# Patient Record
Sex: Male | Born: 1960 | Race: Black or African American | Hispanic: No | Marital: Single | Smoking: Current some day smoker
Health system: Southern US, Community
[De-identification: ages and names within clinical notes are randomized; demographics above are authoritative.]

## PROBLEM LIST (undated history)

## (undated) DIAGNOSIS — G629 Polyneuropathy, unspecified: Secondary | ICD-10-CM

## (undated) DIAGNOSIS — I2510 Atherosclerotic heart disease of native coronary artery without angina pectoris: Secondary | ICD-10-CM

## (undated) DIAGNOSIS — E119 Type 2 diabetes mellitus without complications: Secondary | ICD-10-CM

## (undated) DIAGNOSIS — N183 Chronic kidney disease, stage 3 unspecified: Secondary | ICD-10-CM

## (undated) DIAGNOSIS — D649 Anemia, unspecified: Secondary | ICD-10-CM

## (undated) DIAGNOSIS — I5042 Chronic combined systolic (congestive) and diastolic (congestive) heart failure: Secondary | ICD-10-CM

## (undated) DIAGNOSIS — I10 Essential (primary) hypertension: Secondary | ICD-10-CM

## (undated) DIAGNOSIS — M1990 Unspecified osteoarthritis, unspecified site: Secondary | ICD-10-CM

## (undated) HISTORY — PX: CARDIAC CATHETERIZATION: SHX172

## (undated) HISTORY — PX: CORONARY STENT PLACEMENT: SHX1402

## (undated) HISTORY — PX: CORONARY ANGIOPLASTY: SHX604

---

## 2008-09-29 ENCOUNTER — Ambulatory Visit: Payer: Self-pay | Admitting: Diagnostic Radiology

## 2008-09-29 ENCOUNTER — Emergency Department (HOSPITAL_BASED_OUTPATIENT_CLINIC_OR_DEPARTMENT_OTHER): Admission: EM | Admit: 2008-09-29 | Discharge: 2008-09-29 | Payer: Self-pay | Admitting: Emergency Medicine

## 2009-06-03 ENCOUNTER — Ambulatory Visit: Payer: Self-pay | Admitting: Diagnostic Radiology

## 2009-06-03 ENCOUNTER — Emergency Department (HOSPITAL_BASED_OUTPATIENT_CLINIC_OR_DEPARTMENT_OTHER): Admission: EM | Admit: 2009-06-03 | Discharge: 2009-06-03 | Payer: Self-pay | Admitting: Emergency Medicine

## 2009-09-20 ENCOUNTER — Emergency Department (HOSPITAL_BASED_OUTPATIENT_CLINIC_OR_DEPARTMENT_OTHER): Admission: EM | Admit: 2009-09-20 | Discharge: 2009-09-21 | Payer: Self-pay | Admitting: Emergency Medicine

## 2009-09-21 ENCOUNTER — Ambulatory Visit: Payer: Self-pay | Admitting: Diagnostic Radiology

## 2010-07-07 LAB — BASIC METABOLIC PANEL
CO2: 25 mEq/L (ref 19–32)
Calcium: 9.1 mg/dL (ref 8.4–10.5)
Chloride: 107 mEq/L (ref 96–112)
Potassium: 4 mEq/L (ref 3.5–5.1)
Sodium: 143 mEq/L (ref 135–145)

## 2010-07-09 LAB — GLUCOSE, CAPILLARY

## 2012-02-27 ENCOUNTER — Encounter (HOSPITAL_BASED_OUTPATIENT_CLINIC_OR_DEPARTMENT_OTHER): Payer: Self-pay | Admitting: Emergency Medicine

## 2012-02-27 ENCOUNTER — Emergency Department (HOSPITAL_BASED_OUTPATIENT_CLINIC_OR_DEPARTMENT_OTHER): Payer: Self-pay

## 2012-02-27 ENCOUNTER — Emergency Department (HOSPITAL_BASED_OUTPATIENT_CLINIC_OR_DEPARTMENT_OTHER)
Admission: EM | Admit: 2012-02-27 | Discharge: 2012-02-27 | Disposition: A | Discharge status: Home / Self Care | Payer: Self-pay | Attending: Emergency Medicine | Admitting: Emergency Medicine

## 2012-02-27 DIAGNOSIS — F17200 Nicotine dependence, unspecified, uncomplicated: Secondary | ICD-10-CM | POA: Insufficient documentation

## 2012-02-27 DIAGNOSIS — M25549 Pain in joints of unspecified hand: Secondary | ICD-10-CM | POA: Insufficient documentation

## 2012-02-27 DIAGNOSIS — M549 Dorsalgia, unspecified: Secondary | ICD-10-CM

## 2012-02-27 DIAGNOSIS — M79643 Pain in unspecified hand: Secondary | ICD-10-CM

## 2012-02-27 DIAGNOSIS — Z79899 Other long term (current) drug therapy: Secondary | ICD-10-CM | POA: Insufficient documentation

## 2012-02-27 DIAGNOSIS — E119 Type 2 diabetes mellitus without complications: Secondary | ICD-10-CM | POA: Insufficient documentation

## 2012-02-27 HISTORY — DX: Type 2 diabetes mellitus without complications: E11.9

## 2012-02-27 MED ORDER — DIAZEPAM 5 MG PO TABS
5.0000 mg | ORAL_TABLET | Freq: Once | ORAL | Status: AC
  Administered 2012-02-27: 5 mg via ORAL
  Filled 2012-02-27: qty 1

## 2012-02-27 MED ORDER — HYDROCODONE-ACETAMINOPHEN 5-325 MG PO TABS: 1.0000 | ORAL_TABLET | Freq: Four times a day (QID) | ORAL | Status: DC | PRN

## 2012-02-27 MED ORDER — DIAZEPAM 5 MG PO TABS: 5.0000 mg | ORAL_TABLET | Freq: Two times a day (BID) | ORAL | Status: DC | PRN

## 2012-02-27 MED ORDER — IBUPROFEN 800 MG PO TABS
800.0000 mg | ORAL_TABLET | Freq: Once | ORAL | Status: AC
  Administered 2012-02-27: 800 mg via ORAL
  Filled 2012-02-27: qty 1

## 2012-02-27 MED ORDER — HYDROCODONE-ACETAMINOPHEN 5-325 MG PO TABS
1.0000 | ORAL_TABLET | Freq: Once | ORAL | Status: AC
  Administered 2012-02-27: 1 via ORAL
  Filled 2012-02-27: qty 1

## 2012-02-27 MED ORDER — IBUPROFEN 800 MG PO TABS: 800.0000 mg | ORAL_TABLET | Freq: Three times a day (TID) | ORAL | Status: AC

## 2012-02-27 NOTE — ED Provider Notes (Addendum)
History     CSN: [id number]  Arrival date & time 02/27/12  4098   First MD Initiated Contact with Patient 02/27/12 [phone]      Chief Complaint  Patient presents with  . Back Pain  . Hand Pain    HPI  The patient presents with 2 main complaints. Chief complaint 1 back pain.  He notes over the past 3 days he said pain persistently in his low back, on the right greater than the left.  Pain is dull, though with sharp radiation down the posterior of the right leg.  The pain is worse with ambulation and weight-bearing.  The pain is minimally improved with use of narcotics, and OTC medication.  He denies concurrent fever, chills, distal dysesthesia, distal weakness, any incontinence. Chief complaint 2 finger pain.  He notes over the past months he has had pain in his 3 middle digits on the right hand.  The pain is focally about the MCP joints with radiation both proximally and distally.  The pain is sore, with associated edema.  There is no associated erythema, or distal dysesthesia.  The patient can move the hand in all dimensions, though there is pain with most motion.  Pain is minimally improved with the aforementioned analgesics. 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   Past Medical History  Diagnosis Date  . Diabetes mellitus without complication     No past surgical history on file.  No family history on file.  History  Substance Use Topics  . Smoking status: Current Every Day Smoker  . Smokeless tobacco: Not on file  . Alcohol Use:       Review of Systems  Constitutional:       Per HPI, otherwise negative  HENT:       Per HPI, otherwise negative  Eyes: Negative.   Respiratory:       Per HPI, otherwise negative  Cardiovascular:       Per HPI, otherwise negative  Gastrointestinal: Negative for nausea, vomiting, diarrhea and constipation.  Genitourinary: Negative.   Musculoskeletal:       Per HPI, otherwise negative  Skin: Negative.     Neurological: Negative for weakness and light-headedness.    Allergies  Review of patient's allergies indicates no known allergies.  Home Medications   Current Outpatient Rx  Name  Route  Sig  Dispense  Refill  . METFORMIN HCL 500 MG PO TABS   Oral   Take 1,000 mg by mouth 2 (two) times daily with a meal.            BP 141/86  Pulse 79  Temp 98 F (36.7 C) (Oral)  Resp 20  Ht 5\' 9"  (1.753 m)  Wt 199 lb (90.266 kg)  BMI 29.39 kg/m2  SpO2 98%  Physical Exam  Nursing note and vitals reviewed. Constitutional: He is oriented to person, place, and time. He appears well-developed. No distress.  HENT:  Head: Normocephalic and atraumatic.  Eyes: Conjunctivae normal and EOM are normal.  Cardiovascular: Normal rate and regular rhythm.   Pulmonary/Chest: Effort normal. No stridor. No respiratory distress.  Abdominal: He exhibits no distension.  Musculoskeletal: He exhibits no edema.       The right hand is slightly edematous compared to the left, with tenderness to palpation about the MCP and metacarpals primarily in digits 2, 3, 4.  The patient can flex and extend all digits, though he is diminished grip capacity secondary to pain. No teno-synovitis,  no snuffbox tenderness no wrist pain with palpation The patient can flex and extend each hip, though there is pain with straight leg rise on the right.  The knees and ankles are unremarkable.  The pelvis is stable.  Neurological: He is alert and oriented to person, place, and time. No cranial nerve deficit. He exhibits normal muscle tone. Coordination normal.  Skin: Skin is warm and dry.  Psychiatric: He has a normal mood and affect.    ED Course  Procedures (including critical care time)  Labs Reviewed - No data to display No results found.   No diagnosis found.  X-ray interpreted by me  MDM  This patient, presents in no distress, with 2 complaints.  Notably, the patient's back pain is new, most consistent with  musculoskeletal etiology absent neurologic complaints or findings. The patient's hand pain may be secondary to repetitive use.  With a negative x-ray, the patient was counseled on the need for ongoing analgesics, anti-inflammatories, as well as consideration of rest.  The patient was discharged with analgesics, following a discussion on the need for outpatient followup, consideration of physical therapy.  The patient has followup in 2 days.     Gerhard [name], MD 02/27/12 5621  Gerhard [name], MD 02/27/12 [phone]

## 2012-02-27 NOTE — ED Notes (Signed)
Pt c/o lower back pain since Wed.  No elimination problems.  Pt denies numbness.  Pt also c/o right hand/finger pain.  No injuries known.

## 2013-12-09 ENCOUNTER — Encounter (HOSPITAL_BASED_OUTPATIENT_CLINIC_OR_DEPARTMENT_OTHER): Payer: Self-pay | Admitting: Emergency Medicine

## 2013-12-09 ENCOUNTER — Emergency Department (HOSPITAL_BASED_OUTPATIENT_CLINIC_OR_DEPARTMENT_OTHER)
Admission: EM | Admit: 2013-12-09 | Discharge: 2013-12-09 | Disposition: A | Discharge status: Home / Self Care | Payer: Self-pay | Attending: Emergency Medicine | Admitting: Emergency Medicine

## 2013-12-09 DIAGNOSIS — F17200 Nicotine dependence, unspecified, uncomplicated: Secondary | ICD-10-CM | POA: Insufficient documentation

## 2013-12-09 DIAGNOSIS — M6281 Muscle weakness (generalized): Secondary | ICD-10-CM | POA: Insufficient documentation

## 2013-12-09 DIAGNOSIS — R358 Other polyuria: Secondary | ICD-10-CM | POA: Insufficient documentation

## 2013-12-09 DIAGNOSIS — J029 Acute pharyngitis, unspecified: Secondary | ICD-10-CM | POA: Insufficient documentation

## 2013-12-09 DIAGNOSIS — R52 Pain, unspecified: Secondary | ICD-10-CM

## 2013-12-09 DIAGNOSIS — E119 Type 2 diabetes mellitus without complications: Secondary | ICD-10-CM | POA: Insufficient documentation

## 2013-12-09 DIAGNOSIS — R739 Hyperglycemia, unspecified: Secondary | ICD-10-CM

## 2013-12-09 DIAGNOSIS — R3589 Other polyuria: Secondary | ICD-10-CM | POA: Insufficient documentation

## 2013-12-09 DIAGNOSIS — Z79899 Other long term (current) drug therapy: Secondary | ICD-10-CM | POA: Insufficient documentation

## 2013-12-09 LAB — CBC WITH DIFFERENTIAL/PLATELET
BASOS ABS: 0 10*3/uL (ref 0.0–0.1)
BASOS PCT: 0 % (ref 0–1)
EOS ABS: 0.1 10*3/uL (ref 0.0–0.7)
Eosinophils Relative: 1 % (ref 0–5)
HCT: 42.3 % (ref 39.0–52.0)
Hemoglobin: 14.9 g/dL (ref 13.0–17.0)
LYMPHS ABS: 1.5 10*3/uL (ref 0.7–4.0)
Lymphocytes Relative: 26 % (ref 12–46)
MCH: 32.3 pg (ref 26.0–34.0)
MCHC: 35.2 g/dL (ref 30.0–36.0)
MCV: 91.6 fL (ref 78.0–100.0)
Monocytes Absolute: 1 10*3/uL (ref 0.1–1.0)
Monocytes Relative: 17 % — ABNORMAL HIGH (ref 3–12)
NEUTROS ABS: 3.2 10*3/uL (ref 1.7–7.7)
Neutrophils Relative %: 56 % (ref 43–77)
Platelets: 177 10*3/uL (ref 150–400)
RBC: 4.62 MIL/uL (ref 4.22–5.81)
RDW: 12.1 % (ref 11.5–15.5)
WBC: 5.7 10*3/uL (ref 4.0–10.5)

## 2013-12-09 LAB — COMPREHENSIVE METABOLIC PANEL
ALBUMIN: 3.1 g/dL — AB (ref 3.5–5.2)
ALK PHOS: 54 U/L (ref 39–117)
ALT: 33 U/L (ref 0–53)
AST: 33 U/L (ref 0–37)
Anion gap: 14 (ref 5–15)
BUN: 12 mg/dL (ref 6–23)
CALCIUM: 9.5 mg/dL (ref 8.4–10.5)
CHLORIDE: 94 meq/L — AB (ref 96–112)
CO2: 23 mEq/L (ref 19–32)
Creatinine, Ser: 1 mg/dL (ref 0.50–1.35)
GFR calc Af Amer: >90 mL/min (ref >90)
GFR calc non Af Amer: 84 mL/min — ABNORMAL LOW (ref >90)
GLUCOSE: 508 mg/dL — AB (ref 70–99)
POTASSIUM: 4 meq/L (ref 3.7–5.3)
SODIUM: 131 meq/L — AB (ref 137–147)
Total Bilirubin: 0.3 mg/dL (ref 0.3–1.2)
Total Protein: 7.1 g/dL (ref 6.0–8.3)

## 2013-12-09 LAB — CBG MONITORING, ED: Glucose-Capillary: 89 mg/dL (ref 70–99)

## 2013-12-09 LAB — I-STAT VENOUS BLOOD GAS, ED
BICARBONATE: 25.6 meq/L — AB (ref 20.0–24.0)
O2 Saturation: 90 %
PH VEN: 7.391 — AB (ref 7.250–7.300)
PO2 VEN: 59 mmHg — AB (ref 30.0–45.0)
TCO2: 27 mmol/L (ref 0–100)
pCO2, Ven: 42.2 mmHg — ABNORMAL LOW (ref 45.0–50.0)

## 2013-12-09 LAB — URINALYSIS, ROUTINE W REFLEX MICROSCOPIC
BILIRUBIN URINE: NEGATIVE
KETONES UR: NEGATIVE mg/dL
Nitrite: NEGATIVE
Protein, ur: 30 mg/dL — AB
SPECIFIC GRAVITY, URINE: 1.041 — AB (ref 1.005–1.030)
Urobilinogen, UA: 1 mg/dL (ref 0.0–1.0)
pH: 6 (ref 5.0–8.0)

## 2013-12-09 LAB — URINE MICROSCOPIC-ADD ON

## 2013-12-09 MED ORDER — INSULIN GLARGINE 100 UNIT/ML ~~LOC~~ SOLN
10.0000 [IU] | Freq: Once | SUBCUTANEOUS | Status: DC
Start: 2013-12-09 — End: 2013-12-09
  Filled 2013-12-09: qty 10

## 2013-12-09 MED ORDER — SODIUM CHLORIDE 0.9 % IV BOLUS (SEPSIS)
1000.0000 mL | Freq: Once | INTRAVENOUS | Status: AC
  Administered 2013-12-09: 1000 mL via INTRAVENOUS

## 2013-12-09 MED ORDER — INSULIN ASPART 100 UNIT/ML ~~LOC~~ SOLN
10.0000 [IU] | Freq: Once | SUBCUTANEOUS | Status: AC
  Administered 2013-12-09: 10 [IU] via INTRAVENOUS
  Filled 2013-12-09: qty 1

## 2013-12-09 NOTE — Discharge Instructions (Signed)
As discussed, it is important that you follow up as soon as possible with your physician for continued management of your condition. ° °If you develop any new, or concerning changes in your condition, please return to the emergency department immediately. ° °

## 2013-12-09 NOTE — ED Provider Notes (Signed)
CSN: [id number]     Arrival date & time 12/09/13  1827 History  This chart was scribed for Eddie Foster Ervan Heber, MD, by Yevette EdwardsAngela Bracken, ED Scribe. This patient was seen in room MH07/MH07 and the patient's care was started at 7:20 PM.   First MD Initiated Contact with Patient 12/09/13 1909     Chief Complaint  Patient presents with  . Generalized Body Aches    The history is provided by the patient. No language interpreter was used.   HPI Comments: Eddie McalpineRonnie Foster is a 53 y.o. male, with a h/o DM, who presents to the Emergency Department complaining of a fever, onset of three days, which has gradually improved. Mr. Eddie Foster also endorses myalgia, a headache, fatigue, a sore throat and chills.  Additionally, he voices two days of diaphoresis. In the ED his temperature is 98.1 F. Mr. Eddie Foster states he has increased his fluid consumption to treat his symptoms, and he attributes his polyuria to the increased fluids. He denies a h/o DKA. He also denies chest pains, dyspnea, and abdominal pain. Mr. Eddie Foster reports a h/o Hepatitis C.     Past Medical History  Diagnosis Date  . Diabetes mellitus without complication    History reviewed. No pertinent past surgical history. No family history on file. History  Substance Use Topics  . Smoking status: Current Every Day Smoker  . Smokeless tobacco: Not on file  . Alcohol Use: No    Review of Systems  Constitutional: Positive for fever, chills, diaphoresis and fatigue.  HENT: Positive for sore throat.   Respiratory: Negative for shortness of breath.   Cardiovascular: Negative for chest pain.  Gastrointestinal: Negative for abdominal pain.  Endocrine: Positive for polyuria.  Musculoskeletal: Positive for myalgias.  All other systems reviewed and are negative.   Allergies  Review of patient's allergies indicates no known allergies.  Home Medications   Prior to Admission medications   Medication Sig Start Date End Date Taking? Authorizing Provider   diazepam (VALIUM) 5 MG tablet Take 1 tablet (5 mg total) by mouth every 12 (twelve) hours as needed for anxiety. 02/27/12   Eddie Foster Eddie Goldwasser, MD  HYDROcodone-acetaminophen (NORCO/VICODIN) 5-325 MG per tablet Take 1 tablet by mouth every 6 (six) hours as needed for pain. 02/27/12   Eddie Foster Eddie Cavallaro, MD  metFORMIN (GLUCOPHAGE) 500 MG tablet Take 1,000 mg by mouth 2 (two) times daily with a meal.     Historical Provider, MD   Triage Vitals: BP 124/94  Pulse 103  Temp(Src) 98.1 F (36.7 C) (Oral)  Resp 16  Ht 5\' 8"  (1.727 m)  Wt 160 lb (72.576 kg)  BMI 24.33 kg/m2  SpO2 100%  Physical Exam  Nursing note and vitals reviewed. Constitutional: He is oriented to person, place, and time. He appears well-developed. No distress.  HENT:  Head: Normocephalic and atraumatic.  Eyes: Conjunctivae and EOM are normal.  Cardiovascular: Normal rate, regular rhythm and normal heart sounds.   No murmur heard. Pulmonary/Chest: Effort normal and breath sounds normal. No stridor. No respiratory distress. He has no wheezes. He has no rales.  Abdominal: He exhibits no distension. There is no tenderness.  Musculoskeletal: He exhibits no edema.  Neurological: He is alert and oriented to person, place, and time.  Skin: Skin is warm and dry.  Psychiatric: He has a normal mood and affect.    ED Course  Procedures (including critical care time)  DIAGNOSTIC STUDIES: Oxygen Saturation is 100% on room air, normal by my interpretation.    COORDINATION  OF CARE:  7:24 PM- Discussed treatment plan with patient, and the patient agreed to the plan. The plan includes pain medication, IV fluids, CBC, CMP, Venous Blood Gas, and a UA.   Labs Review Labs Reviewed  CBC WITH DIFFERENTIAL - Abnormal; Notable for the following:    Monocytes Relative 17 (*)    All other components within normal limits  COMPREHENSIVE METABOLIC PANEL - Abnormal; Notable for the following:    Sodium 131 (*)    Chloride 94 (*)    Glucose, Bld  508 (*)    Albumin 3.1 (*)    GFR calc non Af Amer 84 (*)    All other components within normal limits  URINALYSIS, ROUTINE W REFLEX MICROSCOPIC - Abnormal; Notable for the following:    APPearance CLOUDY (*)    Specific Gravity, Urine 1.041 (*)    Glucose, UA >1000 (*)    Hgb urine dipstick MODERATE (*)    Protein, ur 30 (*)    Leukocytes, UA SMALL (*)    All other components within normal limits  URINE MICROSCOPIC-ADD ON - Abnormal; Notable for the following:    Bacteria, UA FEW (*)    All other components within normal limits  I-STAT VENOUS BLOOD GAS, ED - Abnormal; Notable for the following:    pH, Ven 7.391 (*)    pCO2, Ven 42.2 (*)    pO2, Ven 59.0 (*)    Bicarbonate 25.6 (*)    All other components within normal limits  BLOOD GAS, VENOUS  CBG MONITORING, ED   Update: Symptoms have resolved, blood glucose is less than 100, no new complaints.  We discussed at length, the need for primary care followup. MDM   Final diagnoses:  Hyperglycemia  Body aches    I personally performed the services described in this documentation, which was scribed in my presence. The recorded information has been reviewed and is accurate.   Patient presents with generalized body aches, in spite of hyperglycemia, with a portal and anion gap.  Patient has resolution of symptoms, and substantial decrease in his close level with no complaints, no fever, chills or other suggestion of early infection or metabolic compromise.  Patient was discharged in stable condition to followup with primary care for consideration of new antihyperglycemic regimen.  Eddie [name], MD 12/10/13 [phone]

## 2013-12-09 NOTE — ED Notes (Signed)
Reports chills and fever x 2 days. Sts bones hurt.

## 2014-06-17 ENCOUNTER — Encounter (HOSPITAL_BASED_OUTPATIENT_CLINIC_OR_DEPARTMENT_OTHER): Payer: Self-pay | Admitting: *Deleted

## 2014-06-17 ENCOUNTER — Emergency Department (HOSPITAL_BASED_OUTPATIENT_CLINIC_OR_DEPARTMENT_OTHER)
Admission: EM | Admit: 2014-06-17 | Discharge: 2014-06-17 | Disposition: A | Discharge status: Home / Self Care | Payer: Self-pay | Attending: Emergency Medicine | Admitting: Emergency Medicine

## 2014-06-17 DIAGNOSIS — Z79899 Other long term (current) drug therapy: Secondary | ICD-10-CM | POA: Insufficient documentation

## 2014-06-17 DIAGNOSIS — Z87891 Personal history of nicotine dependence: Secondary | ICD-10-CM | POA: Insufficient documentation

## 2014-06-17 DIAGNOSIS — E1142 Type 2 diabetes mellitus with diabetic polyneuropathy: Secondary | ICD-10-CM | POA: Insufficient documentation

## 2014-06-17 DIAGNOSIS — E1140 Type 2 diabetes mellitus with diabetic neuropathy, unspecified: Secondary | ICD-10-CM

## 2014-06-17 HISTORY — DX: Polyneuropathy, unspecified: G62.9

## 2014-06-17 MED ORDER — PREGABALIN 50 MG PO CAPS: 50.0000 mg | ORAL_CAPSULE | Freq: Two times a day (BID) | ORAL | Status: DC

## 2014-06-17 MED ORDER — OXYCODONE-ACETAMINOPHEN 5-325 MG PO TABS: 1.0000 | ORAL_TABLET | Freq: Four times a day (QID) | ORAL | Status: DC | PRN

## 2014-06-17 NOTE — ED Notes (Signed)
Pt standing at door, ready to go.  Left without repeating vital signs.

## 2014-06-17 NOTE — ED Notes (Signed)
Pt c/o leg pain- has been dx with neuropathy at PCP last week but meds not working

## 2014-06-17 NOTE — Discharge Instructions (Signed)
Diabetic Neuropathy Diabetic neuropathy is a nerve disease or nerve damage that is caused by diabetes mellitus. About half of all people with diabetes mellitus have some form of nerve damage. Nerve damage is more common in those who have had diabetes mellitus for many years and who generally have not had good control of their blood sugar (glucose) level. Diabetic neuropathy is a common complication of diabetes mellitus. There are three more common types of diabetic neuropathy and a fourth type that is less common and less understood:   Peripheral neuropathy--This is the most common type of diabetic neuropathy. It causes damage to the nerves of the feet and legs first and then eventually the hands and arms.The damage affects the ability to sense touch.  Autonomic neuropathy--This type causes damage to the autonomic nervous system, which controls the following functions:  Heartbeat.  Body temperature.  Blood pressure.  Urination.  Digestion.  Sweating.  Sexual function.  Focal neuropathy--Focal neuropathy can be painful and unpredictable and occurs most often in older adults with diabetes mellitus. It involves a specific nerve or one area and often comes on suddenly. It usually does not cause long-term problems.  Radiculoplexus neuropathy-- Sometimes called lumbosacral radiculoplexus neuropathy, radiculoplexus neuropathy affects the nerves of the thighs, hips, buttocks, or legs. It is more common in people with type 2 diabetes mellitus and in older men. It is characterized by debilitating pain, weakness, and atrophy, usually in the thigh muscles. CAUSES  The cause of peripheral, autonomic, and focal neuropathies is diabetes mellitus that is uncontrolled and high glucose levels. The cause of radiculoplexus neuropathy is unknown. However, it is thought to be caused by inflammation related to uncontrolled glucose levels. SIGNS AND SYMPTOMS  Peripheral Neuropathy Peripheral neuropathy develops  slowly over time. When the nerves of the feet and legs no longer work there may be:   Burning, stabbing, or aching pain in the legs or feet.  Inability to feel pressure or pain in your feet. This can lead to:  Thick calluses over pressure areas.  Pressure sores.  Ulcers.  Foot deformities.  Reduced ability to feel temperature changes.  Muscle weakness. Autonomic Neuropathy The symptoms of autonomic neuropathy vary depending on which nerves are affected. Symptoms may include:  Problems with digestion, such as:  Feeling sick to your stomach (nausea).  Vomiting.  Bloating.  Constipation.  Diarrhea.  Abdominal pain.  Difficulty with urination. This occurs if you lose your ability to sense when your bladder is full. Problems include:  Urine leakage (incontinence).  Inability to empty your bladder completely (retention).  Rapid or irregular heartbeat (palpitations).  Blood pressure drops when you stand up (orthostatic hypotension). When you stand up you may feel:  Dizzy.  Weak.  Faint.  In men, inability to attain and maintain an erection.  In women, vaginal dryness and problems with decreased sexual desire and arousal.  Problems with body temperature regulation.  Increased or decreased sweating. Focal Neuropathy  Abnormal eye movements or abnormal alignment of both eyes.  Weakness in the wrist.  Foot drop. This results in an inability to lift the foot properly and abnormal walking or foot movement.  Paralysis on one side of your face (Bell palsy).  Chest or abdominal pain. Radiculoplexus Neuropathy  Sudden, severe pain in your hip, thigh, or buttocks.  Weakness and wasting of thigh muscles.  Difficulty rising from a seated position.  Abdominal swelling.  Unexplained weight loss (usually more than 10 lb [4.5 kg]). DIAGNOSIS  Peripheral Neuropathy Your senses may   be tested. Sensory function testing can be done with:  A light touch using a  monofilament.  A vibration with tuning fork.  A sharp sensation with a pin prick. Other tests that can help diagnose neuropathy are:  Nerve conduction velocity. This test checks the transmission of an electrical current through a nerve.  Electromyography. This shows how muscles respond to electrical signals transmitted by nearby nerves.  Quantitative sensory testing. This is used to assess how your nerves respond to vibrations and changes in temperature. Autonomic Neuropathy Diagnosis is often based on reported symptoms. Tell your health care provider if you experience:   Dizziness.   Constipation.   Diarrhea.   Inappropriate urination or inability to urinate.   Inability to get or maintain an erection.  Tests that may be done include:   Electrocardiography or Holter monitor. These are tests that can help show problems with the heart rate or heart rhythm.   An X-ray exam may be done. Focal Neuropathy Diagnosis is made based on your symptoms and what your health care provider finds during your exam. Other tests may be done. They may include:  Nerve conduction velocities. This checks the transmission of electrical current through a nerve.  Electromyography. This shows how muscles respond to electrical signals transmitted by nearby nerves.  Quantitative sensory testing. This test is used to assess how your nerves respond to vibration and changes in temperature. Radiculoplexus Neuropathy  Often the first thing is to eliminate any other issue or problems that might be the cause, as there is no stick test for diagnosis.  X-ray exam of your spine and lumbar region.  Spinal tap to rule out cancer.  MRI to rule out other lesions. TREATMENT  Once nerve damage occurs, it cannot be reversed. The goal of treatment is to keep the disease or nerve damage from getting worse and affecting more nerve fibers. Controlling your blood glucose level is the key. Most people with  radiculoplexus neuropathy see at least a partial improvement over time. You will need to keep your blood glucose and HbA1c levels in the target range determined by your health care provider. Things that help control blood glucose levels include:   Blood glucose monitoring.   Meal planning.   Physical activity.   Diabetes medicine.  Over time, maintaining lower blood glucose levels helps lessen symptoms. Sometimes, prescription pain medicine is needed. HOME CARE INSTRUCTIONS:  Do not smoke.  Keep your blood glucose level in the range that you and your health care provider have determined acceptable for you.  Keep your blood pressure level in the range that you and your health care provider have determined acceptable for you.  Eat a well-balanced diet.  Be active every day.  Check your feet every day. SEEK MEDICAL CARE IF:   You have burning, stabbing, or aching pain in the legs or feet.  You are unable to feel pressure or pain in your feet.  You develop problems with digestion such as:  Nausea.  Vomiting.  Bloating.  Constipation.  Diarrhea.  Abdominal pain.  You have difficulty with urination, such as:  Incontinence.  Retention.  You have palpitations.  You develop orthostatic hypotension. When you stand up you may feel:  Dizzy.  Weak.  Faint.  You cannot attain and maintain an erection (in men).  You have vaginal dryness and problems with decreased sexual desire and arousal (in women).  You have severe pain in your thighs, legs, or buttocks.  You have unexplained weight loss.   Document Released: 06/15/2001 Document Revised: 01/25/2013 Document Reviewed: 09/15/2012 [employer] Patient Information 2015 [employer]. This information is not intended to replace advice given to you by your health care provider. Make sure you discuss any questions you have with your health care provider. 

## 2014-06-17 NOTE — ED Provider Notes (Signed)
CSN: [id number]     Arrival date & time 06/17/14  1440 History   First MD Initiated Contact with Patient 06/17/14 1627     Chief Complaint  Patient presents with  . Leg Pain     (Consider location/radiation/quality/duration/timing/severity/associated sxs/prior Treatment) Patient is a 54 y.o. male presenting with leg pain.  Leg Pain Associated symptoms: no fever    patient has had pain in his left lower leg the last few weeks. [facility] and started on ibuprofen. Diagnosed with diabetic neuropathy. He feels a burning pain. It is worse with walking. No fevers. No trauma. No relief with his ibuprofen. Sugars have been up to 200 at the highest. No chest pain. No trouble breathing. No rash.  Past Medical History  Diagnosis Date  . Diabetes mellitus without complication   . Neuropathy    History reviewed. No pertinent past surgical history. No family history on file. History  Substance Use Topics  . Smoking status: Former Smoker    Types: Cigarettes  . Smokeless tobacco: Not on file  . Alcohol Use: No    Review of Systems  Constitutional: Negative for fever.  Respiratory: Negative for shortness of breath.   Cardiovascular: Negative for leg swelling.  Gastrointestinal: Negative for abdominal pain.  Musculoskeletal: Positive for gait problem. Negative for joint swelling.  Skin: Negative for rash and wound.  Neurological: Positive for numbness. Negative for facial asymmetry and weakness.      Allergies  Review of patient's allergies indicates no known allergies.  Home Medications   Prior to Admission medications   Medication Sig Start Date End Date Taking? Authorizing Provider  ibuprofen (ADVIL,MOTRIN) 600 MG tablet Take 600 mg by mouth every 6 (six) hours as needed.   Yes Historical Provider, MD  metFORMIN (GLUCOPHAGE) 500 MG tablet Take 1,000 mg by mouth 2 (two) times daily with a meal.    Yes Historical Provider, MD  diazepam (VALIUM) 5 MG tablet  Take 1 tablet (5 mg total) by mouth every 12 (twelve) hours as needed for anxiety. 02/27/12   Gerhard [name], MD  HYDROcodone-acetaminophen (NORCO/VICODIN) 5-325 MG per tablet Take 1 tablet by mouth every 6 (six) hours as needed for pain. 02/27/12   Gerhard [name], MD  oxyCODONE-acetaminophen (PERCOCET/ROXICET) 5-325 MG per tablet Take 1-2 tablets by mouth every 6 (six) hours as needed for severe pain. 06/17/14   Juliet Rude. Ester Mabe, MD  pregabalin (LYRICA) 50 MG capsule Take 1 capsule (50 mg total) by mouth 2 (two) times daily. 06/17/14   Juliet Rude. Afua Hoots, MD   BP 123/109 mmHg  Pulse 99  Temp(Src) 99 F (37.2 C) (Oral)  Resp 20  Ht  (1.753 m)  Wt 181 lb (82.101 kg)  BMI 26.72 kg/m2  SpO2 100% Physical Exam  Constitutional: He appears well-developed and well-nourished.  HENT:  Head: Atraumatic.  Cardiovascular: Normal rate and regular rhythm.   Pulmonary/Chest: Effort normal.  Abdominal: Soft.  Musculoskeletal: Normal range of motion.  Mild tenderness over right calf. No erythema. Strong dorsalis views pulse. No pain over ankle.  Neurological: He is alert.  Skin: Skin is warm.    ED Course  Procedures (including critical care time) Labs Review Labs Reviewed - No data to display  Imaging Review No results found.   EKG Interpretation None      MDM   Final diagnoses:  Diabetic neuropathy, painful    Patient with diabetic neuropathy. Doubt DVT. Will adjust medications and have him follow-up.  Juliet Rude. Rubin Payor, MD 06/17/14 [phone]

## 2014-11-22 ENCOUNTER — Encounter (HOSPITAL_BASED_OUTPATIENT_CLINIC_OR_DEPARTMENT_OTHER): Payer: Self-pay

## 2014-11-22 ENCOUNTER — Emergency Department (HOSPITAL_BASED_OUTPATIENT_CLINIC_OR_DEPARTMENT_OTHER)
Admission: EM | Admit: 2014-11-22 | Discharge: 2014-11-22 | Disposition: A | Discharge status: Home / Self Care | Payer: Self-pay | Attending: Emergency Medicine | Admitting: Emergency Medicine

## 2014-11-22 DIAGNOSIS — Z79899 Other long term (current) drug therapy: Secondary | ICD-10-CM | POA: Insufficient documentation

## 2014-11-22 DIAGNOSIS — M542 Cervicalgia: Secondary | ICD-10-CM | POA: Insufficient documentation

## 2014-11-22 DIAGNOSIS — G8929 Other chronic pain: Secondary | ICD-10-CM | POA: Insufficient documentation

## 2014-11-22 DIAGNOSIS — Z87891 Personal history of nicotine dependence: Secondary | ICD-10-CM | POA: Insufficient documentation

## 2014-11-22 DIAGNOSIS — M1990 Unspecified osteoarthritis, unspecified site: Secondary | ICD-10-CM | POA: Insufficient documentation

## 2014-11-22 DIAGNOSIS — E119 Type 2 diabetes mellitus without complications: Secondary | ICD-10-CM | POA: Insufficient documentation

## 2014-11-22 DIAGNOSIS — M79641 Pain in right hand: Secondary | ICD-10-CM | POA: Insufficient documentation

## 2014-11-22 DIAGNOSIS — M79642 Pain in left hand: Secondary | ICD-10-CM | POA: Insufficient documentation

## 2014-11-22 DIAGNOSIS — G629 Polyneuropathy, unspecified: Secondary | ICD-10-CM | POA: Insufficient documentation

## 2014-11-22 HISTORY — DX: Unspecified osteoarthritis, unspecified site: M19.90

## 2014-11-22 MED ORDER — METHOCARBAMOL 500 MG PO TABS: 500.0000 mg | ORAL_TABLET | Freq: Three times a day (TID) | ORAL | Status: DC | PRN

## 2014-11-22 MED ORDER — KETOROLAC TROMETHAMINE 60 MG/2ML IM SOLN
60.0000 mg | Freq: Once | INTRAMUSCULAR | Status: AC
  Administered 2014-11-22: 60 mg via INTRAMUSCULAR
  Filled 2014-11-22: qty 2

## 2014-11-22 MED ORDER — NAPROXEN 500 MG PO TABS: 500.0000 mg | ORAL_TABLET | Freq: Two times a day (BID) | ORAL | Status: DC

## 2014-11-22 NOTE — Discharge Instructions (Signed)
Take robaxin as prescribed for muscle spasm. No driving or operating heavy machinery while taking this drug as it may cause drowsiness. Take naproxen as prescribed. Follow up with your primary care doctor in 1 week.  Musculoskeletal Pain Musculoskeletal pain is muscle and boney aches and pains. These pains can occur in any part of the body. Your caregiver may treat you without knowing the cause of the pain. They may treat you if blood or urine tests, X-rays, and other tests were normal.  CAUSES There is often not a definite cause or reason for these pains. These pains may be caused by a type of germ (virus). The discomfort may also come from overuse. Overuse includes working out too hard when your body is not fit. Boney aches also come from weather changes. Bone is sensitive to atmospheric pressure changes. HOME CARE INSTRUCTIONS   Ask when your test results will be ready. Make sure you get your test results.  Only take over-the-counter or prescription medicines for pain, discomfort, or fever as directed by your caregiver. If you were given medications for your condition, do not drive, operate machinery or power tools, or sign legal documents for 24 hours. Do not drink alcohol. Do not take sleeping pills or other medications that may interfere with treatment.  Continue all activities unless the activities cause more pain. When the pain lessens, slowly resume normal activities. Gradually increase the intensity and duration of the activities or exercise.  During periods of severe pain, bed rest may be helpful. Lay or sit in any position that is comfortable.  Putting ice on the injured area.  Put ice in a bag.  Place a towel between your skin and the bag.  Leave the ice on for 15 to 20 minutes, 3 to 4 times a day.  Follow up with your caregiver for continued problems and no reason can be found for the pain. If the pain becomes worse or does not go away, it may be necessary to repeat tests or do  additional testing. Your caregiver may need to look further for a possible cause. SEEK IMMEDIATE MEDICAL CARE IF:  You have pain that is getting worse and is not relieved by medications.  You develop chest pain that is associated with shortness or breath, sweating, feeling sick to your stomach (nauseous), or throw up (vomit).  Your pain becomes localized to the abdomen.  You develop any new symptoms that seem different or that concern you. MAKE SURE YOU:   Understand these instructions.  Will watch your condition.  Will get help right away if you are not doing well or get worse. Document Released: 04/06/2005 Document Revised: 06/29/2011 Document Reviewed: 12/09/2012 [facility] Patient Information 2015 [place], [place]. This information is not intended to replace advice given to you by your health care provider. Make sure you discuss any questions you have with your health care provider.  Chronic Pain Chronic pain can be defined as pain that is off and on and lasts for 3-6 months or longer. Many things cause chronic pain, which can make it difficult to make a diagnosis. There are many treatment options available for chronic pain. However, finding a treatment that works well for you may require trying various approaches until the right one is found. Many people benefit from a combination of two or more types of treatment to control their pain. SYMPTOMS  Chronic pain can occur anywhere in the body and can range from mild to very severe. Some types of chronic pain include:  Headache.  Low back pain.  Cancer pain.  Arthritis pain.  Neurogenic pain. This is pain resulting from damage to nerves. People with chronic pain may also have other symptoms such as:  Depression.  Anger.  Insomnia.  Anxiety. DIAGNOSIS  Your health care provider will help diagnose your condition over time. In many cases, the initial focus will be on excluding possible conditions that could be causing the  pain. Depending on your symptoms, your health care provider may order tests to diagnose your condition. Some of these tests may include:   Blood tests.   CT scan.   MRI.   X-rays.   Ultrasounds.   Nerve conduction studies.  You may need to see a specialist.  TREATMENT  Finding treatment that works well may take time. You may be referred to a pain specialist. He or she may prescribe medicine or therapies, such as:   Mindful meditation or yoga.  Shots (injections) of numbing or pain-relieving medicines into the spine or area of pain.  Local electrical stimulation.  Acupuncture.   Massage therapy.   Aroma, color, light, or sound therapy.   Biofeedback.   Working with a physical therapist to keep from getting stiff.   Regular, gentle exercise.   Cognitive or behavioral therapy.   Group support.  Sometimes, surgery may be recommended.  HOME CARE INSTRUCTIONS   Take all medicines as directed by your health care provider.   Lessen stress in your life by relaxing and doing things such as listening to calming music.   Exercise or be active as directed by your health care provider.   Eat a healthy diet and include things such as vegetables, fruits, fish, and lean meats in your diet.   Keep all follow-up appointments with your health care provider.   Attend a support group with others suffering from chronic pain. SEEK MEDICAL CARE IF:   Your pain gets worse.   You develop a new pain that was not there before.   You cannot tolerate medicines given to you by your health care provider.   You have new symptoms since your last visit with your health care provider.  SEEK IMMEDIATE MEDICAL CARE IF:   You feel weak.   You have decreased sensation or numbness.   You lose control of bowel or bladder function.   Your pain suddenly gets much worse.   You develop shaking.  You develop chills.  You develop confusion.  You develop chest  pain.  You develop shortness of breath.  MAKE SURE YOU:  Understand these instructions.  Will watch your condition.  Will get help right away if you are not doing well or get worse. Document Released: 12/27/2001 Document Revised: 12/07/2012 Document Reviewed: 09/30/2012 [facility] Patient Information 2015 [place], [place]. This information is not intended to replace advice given to you by your health care provider. Make sure you discuss any questions you have with your health care provider.

## 2014-11-22 NOTE — ED Provider Notes (Signed)
CSN: [id number]     Arrival date & time 11/22/14  1121 History   First MD Initiated Contact with Patient 11/22/14 1203     Chief Complaint  Patient presents with  . Neck Pain     (Consider location/radiation/quality/duration/timing/severity/associated sxs/prior Treatment) HPI Comments: 54 year old male with a past medical history of arthritis, chronic neck pain, neuropathy and diabetes presenting with gradually worsening neck pain over the past 2 years, worsening this morning when he woke up. States he has been incarcerated, and while incarcerated he was given medicine for his neck twice daily. He was released from prison one week ago and has been without this medication for a week. He does not know the name of the medication. No known injury or trauma. Reports a history of arthritis in his neck and hands. Occasionally he has tingling into his hands and takes gabapentin for neuropathy. Pain is described as an aching sensation that is worse with neck movement and he feels "crunching" in his neck. Pain 8/10 with movement. States hand pain has remained the same, he was advised in the past to stop cracking his knuckles which she is still doing. Denies extremity numbness or weakness. Denies fever, chills or rash.  Patient is a 54 y.o. male presenting with neck pain. The history is provided by the patient.  Neck Pain   Past Medical History  Diagnosis Date  . Diabetes mellitus without complication   . Neuropathy   . Arthritis    History reviewed. No pertinent past surgical history. No family history on file. History  Substance Use Topics  . Smoking status: Former Smoker    Types: Cigarettes  . Smokeless tobacco: Not on file  . Alcohol Use: No    Review of Systems  Musculoskeletal: Positive for neck pain.       + BL hand pain.  All other systems reviewed and are negative.     Allergies  Review of patient's allergies indicates no known allergies.  Home Medications   Prior to  Admission medications   Medication Sig Start Date End Date Taking? Authorizing Provider  GABAPENTIN PO Take by mouth.   Yes Historical Provider, MD  GLYBURIDE PO Take by mouth.   Yes Historical Provider, MD  Sertraline HCl (ZOLOFT PO) Take by mouth.   Yes Historical Provider, MD  metFORMIN (GLUCOPHAGE) 500 MG tablet Take 1,000 mg by mouth 2 (two) times daily with a meal.     Historical Provider, MD  methocarbamol (ROBAXIN) 500 MG tablet Take 1 tablet (500 mg total) by mouth every 8 (eight) hours as needed for muscle spasms. 11/22/14   Kathrynn Speed, PA-C  naproxen (NAPROSYN) 500 MG tablet Take 1 tablet (500 mg total) by mouth 2 (two) times daily. 11/22/14   Natallie Ravenscroft M Aziah Brostrom, PA-C   BP 137/88 mmHg  Pulse 76  Temp(Src) 98.1 F (36.7 C) (Oral)  Resp 18  Ht  (1.727 m)  Wt 180 lb (81.647 kg)  BMI 27.38 kg/m2  SpO2 100% Physical Exam  Constitutional: He is oriented to person, place, and time. He appears well-developed and well-nourished. No distress.  HENT:  Head: Normocephalic and atraumatic.  Mouth/Throat: Oropharynx is clear and moist.  Eyes: Conjunctivae are normal.  Neck: Normal range of motion. Neck supple. No spinous process tenderness and no muscular tenderness present.  No meningeal signs.  Cardiovascular: Normal rate, regular rhythm and normal heart sounds.   Pulmonary/Chest: Effort normal and breath sounds normal. No respiratory distress.  Musculoskeletal: He exhibits no  edema.  TTP lower cervical spine and cervical paraspinal muscles. No edema or step-off. FROM, pain in all directions with a "stiff" feeling.  Neurological: He is alert and oriented to person, place, and time. He has normal strength.  Strength upper extremities 5/5 and equal bilateral. Sensation intact. Normal gait.  Skin: Skin is warm and dry. No rash noted. He is not diaphoretic.  Psychiatric: He has a normal mood and affect. His behavior is normal.  Nursing note and vitals reviewed.   ED Course  Procedures  (including critical care time) Labs Review Labs Reviewed - No data to display  Imaging Review No results found.   EKG Interpretation None      MDM   Final diagnoses:  Chronic neck pain   Nontoxic appearing, NAD. AF VSS. No red flags concerning patient's neck pain. No fevers, rash or meningeal signs concerning for meningitis, no focal neurologic deficits. No signs or symptoms of central cord compression. Pain ongoing for 2 years. Advised the patient to discuss this with his PCP. Will Rx naproxen and Robaxin. Stable for discharge. Return precautions given. Patient states understanding of treatment care plan and is agreeable.   Kathrynn Speed, PA-C 11/22/14 1237  Rolan Bucco, MD 11/22/14 [phone]

## 2014-11-22 NOTE — ED Notes (Signed)
C/o pain to neck and hands-states dx with arthritis approx 1-2 years ago

## 2015-05-04 ENCOUNTER — Emergency Department (HOSPITAL_BASED_OUTPATIENT_CLINIC_OR_DEPARTMENT_OTHER)
Admission: EM | Admit: 2015-05-04 | Discharge: 2015-05-05 | Disposition: A | Discharge status: Home / Self Care | Payer: Self-pay | Attending: Emergency Medicine | Admitting: Emergency Medicine

## 2015-05-04 ENCOUNTER — Encounter (HOSPITAL_BASED_OUTPATIENT_CLINIC_OR_DEPARTMENT_OTHER): Payer: Self-pay | Admitting: Emergency Medicine

## 2015-05-04 DIAGNOSIS — E119 Type 2 diabetes mellitus without complications: Secondary | ICD-10-CM | POA: Insufficient documentation

## 2015-05-04 DIAGNOSIS — R030 Elevated blood-pressure reading, without diagnosis of hypertension: Secondary | ICD-10-CM | POA: Insufficient documentation

## 2015-05-04 DIAGNOSIS — M545 Low back pain, unspecified: Secondary | ICD-10-CM

## 2015-05-04 DIAGNOSIS — Z87891 Personal history of nicotine dependence: Secondary | ICD-10-CM | POA: Insufficient documentation

## 2015-05-04 DIAGNOSIS — M1990 Unspecified osteoarthritis, unspecified site: Secondary | ICD-10-CM | POA: Insufficient documentation

## 2015-05-04 DIAGNOSIS — Y998 Other external cause status: Secondary | ICD-10-CM | POA: Insufficient documentation

## 2015-05-04 DIAGNOSIS — X500XXA Overexertion from strenuous movement or load, initial encounter: Secondary | ICD-10-CM | POA: Insufficient documentation

## 2015-05-04 DIAGNOSIS — IMO0001 Reserved for inherently not codable concepts without codable children: Secondary | ICD-10-CM

## 2015-05-04 DIAGNOSIS — Z7983 Long term (current) use of bisphosphonates: Secondary | ICD-10-CM | POA: Insufficient documentation

## 2015-05-04 DIAGNOSIS — S3992XA Unspecified injury of lower back, initial encounter: Secondary | ICD-10-CM | POA: Insufficient documentation

## 2015-05-04 DIAGNOSIS — Y9389 Activity, other specified: Secondary | ICD-10-CM | POA: Insufficient documentation

## 2015-05-04 DIAGNOSIS — G629 Polyneuropathy, unspecified: Secondary | ICD-10-CM | POA: Insufficient documentation

## 2015-05-04 DIAGNOSIS — Y9289 Other specified places as the place of occurrence of the external cause: Secondary | ICD-10-CM | POA: Insufficient documentation

## 2015-05-04 MED ORDER — ONDANSETRON 4 MG PO TBDP
4.0000 mg | ORAL_TABLET | Freq: Once | ORAL | Status: AC
  Administered 2015-05-04: 4 mg via ORAL
  Filled 2015-05-04: qty 1

## 2015-05-04 MED ORDER — METHOCARBAMOL 500 MG PO TABS: 1000.0000 mg | ORAL_TABLET | Freq: Four times a day (QID) | ORAL | Status: DC

## 2015-05-04 MED ORDER — HYDROCODONE-ACETAMINOPHEN 5-325 MG PO TABS: ORAL_TABLET | ORAL | Status: DC

## 2015-05-04 MED ORDER — DIAZEPAM 5 MG PO TABS
5.0000 mg | ORAL_TABLET | Freq: Once | ORAL | Status: AC
  Administered 2015-05-04: 5 mg via ORAL
  Filled 2015-05-04: qty 1

## 2015-05-04 MED ORDER — HYDROMORPHONE HCL 1 MG/ML IJ SOLN
1.0000 mg | Freq: Once | INTRAMUSCULAR | Status: AC
  Administered 2015-05-04: 1 mg via INTRAMUSCULAR
  Filled 2015-05-04: qty 1

## 2015-05-04 NOTE — Discharge Instructions (Signed)
Please read and follow all provided instructions.  Your diagnoses today include:  1. Bilateral low back pain without sciatica   2. Elevated blood pressure    Tests performed today include:  Vital signs - see below for your results today  Medications prescribed:   Vicodin (hydrocodone/acetaminophen) - narcotic pain medication  DO NOT drive or perform any activities that require you to be awake and alert because this medicine can make you drowsy. BE VERY CAREFUL not to take multiple medicines containing Tylenol (also called acetaminophen). Doing so can lead to an overdose which can damage your liver and cause liver failure and possibly death.   Robaxin (methocarbamol) - muscle relaxer medication  DO NOT drive or perform any activities that require you to be awake and alert because this medicine can make you drowsy.   Take any prescribed medications only as directed.  Home care instructions:   Follow any educational materials contained in this packet  Please rest, use ice or heat on your back for the next several days  Do not lift, push, pull anything more than 10 pounds for the next week  Follow-up instructions: Please follow-up with your primary care provider in the next 1 week for further evaluation of your symptoms.   Return instructions:  SEEK IMMEDIATE MEDICAL ATTENTION IF YOU HAVE:  New numbness, tingling, weakness, or problem with the use of your arms or legs  Severe back pain not relieved with medications  Loss control of your bowels or bladder  Increasing pain in any areas of the body (such as chest or abdominal pain)  Shortness of breath, dizziness, or fainting.   Worsening nausea (feeling sick to your stomach), vomiting, fever, or sweats  Any other emergent concerns regarding your health   Additional Information:  Your vital signs today were: BP 142/111 mmHg   Pulse 74   Temp(Src) 98.1 F (36.7 C) (Oral)   Resp 18   Ht 5\' 9"  (1.753 m)   Wt 83.462 kg    BMI 27.16 kg/m2   SpO2 100% If your blood pressure (BP) was elevated above 135/85 this visit, please have this repeated by your doctor within one month. --------------

## 2015-05-04 NOTE — ED Notes (Signed)
Pt able to ambulate independently in the hallway.

## 2015-05-04 NOTE — ED Notes (Signed)
Patient states that he is having back spasms since this am

## 2015-05-04 NOTE — ED Provider Notes (Signed)
CSN: [id number]     Arrival date & time 05/04/15  2001 History   First MD Initiated Contact with Patient 05/04/15 2156     Chief Complaint  Patient presents with  . Back Pain     (Consider location/radiation/quality/duration/timing/severity/associated sxs/prior Treatment) HPI Comments: Patient presents with complaint of bilateral lower back pain without radiation starting this morning after moving heavy objects yesterday. Pain began gradually worsened throughout the day. Pain is much worse with movement and palpation. Patient describes pain as a spasm. Patient denies warning symptoms of back pain including: fecal incontinence, urinary retention or overflow incontinence, night sweats, waking from sleep with back pain, unexplained fevers or weight loss, h/o cancer including prostate, IVDU, recent trauma. No treatments prior to arrival.   Patient is a 55 y.o. male presenting with back pain. The history is provided by the patient.  Back Pain Associated symptoms: no fever, no numbness and no weakness     Past Medical History  Diagnosis Date  . Diabetes mellitus without complication (HCC)   . Neuropathy (HCC)   . Arthritis    History reviewed. No pertinent past surgical history. History reviewed. No pertinent family history. Social History  Substance Use Topics  . Smoking status: Former Smoker    Types: Cigarettes  . Smokeless tobacco: None  . Alcohol Use: No    Review of Systems  Constitutional: Negative for fever and unexpected weight change.  Gastrointestinal: Negative for constipation.       Neg for fecal incontinence  Genitourinary: Negative for hematuria, flank pain and difficulty urinating.       Negative for urinary incontinence or retention  Musculoskeletal: Positive for back pain.  Neurological: Negative for weakness and numbness.       Negative for saddle paresthesias       Allergies  Review of patient's allergies indicates no known allergies.  Home Medications     Prior to Admission medications   Medication Sig Start Date End Date Taking? Authorizing Provider  GABAPENTIN PO Take by mouth.    Historical Provider, MD  GLYBURIDE PO Take by mouth.    Historical Provider, MD  metFORMIN (GLUCOPHAGE) 500 MG tablet Take 1,000 mg by mouth 2 (two) times daily with a meal.     Historical Provider, MD  methocarbamol (ROBAXIN) 500 MG tablet Take 1 tablet (500 mg total) by mouth every 8 (eight) hours as needed for muscle spasms. 11/22/14   Kathrynn Speed, PA-C  naproxen (NAPROSYN) 500 MG tablet Take 1 tablet (500 mg total) by mouth 2 (two) times daily. 11/22/14   Kathrynn Speed, PA-C  Sertraline HCl (ZOLOFT PO) Take by mouth.    Historical Provider, MD   BP 165/104 mmHg  Pulse 78  Temp(Src) 98.1 F (36.7 C) (Oral)  Resp 18  Ht 5\' 9"  (1.753 m)  Wt 83.462 kg  BMI 27.16 kg/m2  SpO2 100% Physical Exam  Constitutional: He appears well-developed and well-nourished.  Patient appears uncomfortable. Any movement causes pain in his back.  HENT:  Head: Normocephalic and atraumatic.  Eyes: Conjunctivae are normal.  Neck: Normal range of motion. Neck supple.  Cardiovascular: Normal rate and regular rhythm.   No murmur heard. Pulmonary/Chest: Effort normal and breath sounds normal. No respiratory distress.  Abdominal: Soft. He exhibits no distension. There is no tenderness. There is no rebound, no guarding and no CVA tenderness.  No masses or pulsations palpated.  Musculoskeletal:       Cervical back: He exhibits normal range of motion, no  tenderness and no bony tenderness.       Thoracic back: He exhibits no tenderness and no bony tenderness.       Lumbar back: He exhibits decreased range of motion and tenderness. He exhibits no bony tenderness.       Back:  No step-off noted with palpation of spine.   Neurological: He is alert. He has normal reflexes. No sensory deficit. He exhibits normal muscle tone.  5/5 strength in entire lower extremities bilaterally. No  sensation deficit.   Skin: Skin is warm and dry.  Psychiatric: He has a normal mood and affect.  Nursing note and vitals reviewed.   ED Course  Procedures (including critical care time) Labs Review Labs Reviewed - No data to display  Imaging Review No results found. I have personally reviewed and evaluated these images and lab results as part of my medical decision-making.   EKG Interpretation None       10:06 PM Patient seen and examined. Work-up initiated. Medications ordered.   Vital signs reviewed and are as follows: BP 165/104 mmHg  Pulse 78  Temp(Src) 98.1 F (36.7 C) (Oral)  Resp 18  Ht  (1.753 m)  Wt 83.462 kg  BMI 27.16 kg/m2  SpO2 100%  Feels better after pain medicine. Will ambulate.  11:50 PM patient ambulated to the bathroom without difficulty. He is feeling much better. Vital signs remain stable, mild hypertension noted. No red flag s/s of low back pain. Patient was counseled on back pain precautions and told to do activity as tolerated but do not lift, push, or pull heavy objects more than 10 pounds for the next week.  Patient counseled to use ice or heat on back for no longer than 15 minutes every hour.   Patient prescribed muscle relaxer and counseled on proper use of muscle relaxant medication.    Patient prescribed narcotic pain medicine and counseled on proper use of narcotic pain medications. Counseled not to combine this medication with others containing tylenol.   Urged patient not to drink alcohol, drive, or perform any other activities that requires focus while taking either of these medications.  Patient urged to follow-up with PCP if pain does not improve with treatment and rest or if pain becomes recurrent. Urged to return with worsening severe pain, loss of bowel or bladder control, trouble walking.   The patient verbalizes understanding and agrees with the plan.   MDM   Final diagnoses:  Bilateral low back pain without sciatica   Elevated blood pressure   Patient with back pain likely musculoskeletal in nature given exam and history. No neurological deficits. Patient is ambulatory. No warning symptoms of back pain including: fecal incontinence, urinary retention or overflow incontinence, night sweats, waking from sleep with back pain, unexplained fevers or weight loss, h/o cancer, IVDU, recent trauma. No concern for cauda equina, epidural abscess, or other serious cause of back pain. Conservative measures such as rest, ice/heat and pain medicine indicated with PCP follow-up if no improvement with conservative management. Do not feel that advanced imaging is necessary at this time. Abdomen is soft and nontender. No concerning symptoms suggestive of dissection or vascular etiology.      Renne Crigler, PA-C 05/04/15 2351  Glynn Octave, MD 05/05/15 [phone]

## 2018-07-16 ENCOUNTER — Emergency Department (HOSPITAL_COMMUNITY): Payer: Medicaid Other

## 2018-07-16 ENCOUNTER — Emergency Department (HOSPITAL_COMMUNITY)
Admission: EM | Admit: 2018-07-16 | Discharge: 2018-07-17 | Disposition: A | Discharge status: Home / Self Care | Payer: Medicaid Other | Attending: Emergency Medicine | Admitting: Emergency Medicine

## 2018-07-16 ENCOUNTER — Encounter (HOSPITAL_COMMUNITY): Payer: Self-pay | Admitting: Emergency Medicine

## 2018-07-16 DIAGNOSIS — S06339A Contusion and laceration of cerebrum, unspecified, with loss of consciousness of unspecified duration, initial encounter: Secondary | ICD-10-CM

## 2018-07-16 DIAGNOSIS — Y9389 Activity, other specified: Secondary | ICD-10-CM | POA: Insufficient documentation

## 2018-07-16 DIAGNOSIS — S0990XA Unspecified injury of head, initial encounter: Secondary | ICD-10-CM | POA: Diagnosis present

## 2018-07-16 DIAGNOSIS — Y998 Other external cause status: Secondary | ICD-10-CM | POA: Insufficient documentation

## 2018-07-16 DIAGNOSIS — Z87891 Personal history of nicotine dependence: Secondary | ICD-10-CM | POA: Insufficient documentation

## 2018-07-16 DIAGNOSIS — E119 Type 2 diabetes mellitus without complications: Secondary | ICD-10-CM | POA: Insufficient documentation

## 2018-07-16 DIAGNOSIS — Y9289 Other specified places as the place of occurrence of the external cause: Secondary | ICD-10-CM | POA: Diagnosis not present

## 2018-07-16 LAB — COMPREHENSIVE METABOLIC PANEL
ALK PHOS: 112 U/L (ref 38–126)
ALT: 13 U/L (ref 0–44)
AST: 19 U/L (ref 15–41)
Albumin: 3.2 g/dL — ABNORMAL LOW (ref 3.5–5.0)
Anion gap: 10 (ref 5–15)
BILIRUBIN TOTAL: 0.4 mg/dL (ref 0.3–1.2)
BUN: 13 mg/dL (ref 6–20)
CO2: 24 mmol/L (ref 22–32)
Calcium: 9.2 mg/dL (ref 8.9–10.3)
Chloride: 104 mmol/L (ref 98–111)
Creatinine, Ser: 1.72 mg/dL — ABNORMAL HIGH (ref 0.61–1.24)
GFR calc Af Amer: 50 mL/min — ABNORMAL LOW (ref >60)
GFR calc non Af Amer: 43 mL/min — ABNORMAL LOW (ref >60)
Glucose, Bld: 141 mg/dL — ABNORMAL HIGH (ref 70–99)
Potassium: 4.2 mmol/L (ref 3.5–5.1)
Sodium: 138 mmol/L (ref 135–145)
TOTAL PROTEIN: 6.8 g/dL (ref 6.5–8.1)

## 2018-07-16 LAB — URINALYSIS, ROUTINE W REFLEX MICROSCOPIC
Bacteria, UA: NONE SEEN
Bilirubin Urine: NEGATIVE
Glucose, UA: 50 mg/dL — AB
KETONES UR: NEGATIVE mg/dL
Nitrite: NEGATIVE
Protein, ur: 100 mg/dL — AB
Specific Gravity, Urine: 1.012 (ref 1.005–1.030)
pH: 5 (ref 5.0–8.0)

## 2018-07-16 LAB — PROTIME-INR
INR: 1 (ref 0.8–1.2)
Prothrombin Time: 12.9 seconds (ref 11.4–15.2)

## 2018-07-16 LAB — CBC
HCT: 39.1 % (ref 39.0–52.0)
Hemoglobin: 13.1 g/dL (ref 13.0–17.0)
MCH: 31.7 pg (ref 26.0–34.0)
MCHC: 33.5 g/dL (ref 30.0–36.0)
MCV: 94.7 fL (ref 80.0–100.0)
Platelets: 196 10*3/uL (ref 150–400)
RBC: 4.13 MIL/uL — ABNORMAL LOW (ref 4.22–5.81)
RDW: 12.5 % (ref 11.5–15.5)
WBC: 7.5 10*3/uL (ref 4.0–10.5)
nRBC: 0 % (ref 0.0–0.2)

## 2018-07-16 LAB — SAMPLE TO BLOOD BANK

## 2018-07-16 LAB — ETHANOL: Alcohol, Ethyl (B): 108 mg/dL — ABNORMAL HIGH (ref <10)

## 2018-07-16 LAB — LACTIC ACID, PLASMA: Lactic Acid, Venous: 1.8 mmol/L (ref 0.5–1.9)

## 2018-07-16 MED ORDER — IOHEXOL 300 MG/ML  SOLN
100.0000 mL | Freq: Once | INTRAMUSCULAR | Status: AC | PRN
  Administered 2018-07-16: 100 mL via INTRAVENOUS

## 2018-07-16 NOTE — ED Notes (Signed)
Offered patient opportunity to provide urine sample via urinal. Pt stated he did not need to go at this time, will apply condom cath for collection

## 2018-07-16 NOTE — ED Notes (Signed)
Mother Eddie Foster [phone] cell , [phone] home
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
## 2018-07-16 NOTE — ED Notes (Signed)
Patient transported to CT 

## 2018-07-16 NOTE — ED Notes (Addendum)
Family in lobby for patient, MD to update on status Eddie Foster [phone]
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
## 2018-07-17 MED ORDER — HYDROCODONE-ACETAMINOPHEN 5-325 MG PO TABS: 1.0000 | ORAL_TABLET | ORAL | 0 refills | Status: DC | PRN

## 2018-07-17 MED ORDER — ONDANSETRON 4 MG PO TBDP: 4.0000 mg | ORAL_TABLET | Freq: Three times a day (TID) | ORAL | 0 refills | Status: DC | PRN

## 2018-07-17 NOTE — ED Notes (Signed)
Pt given phone and wallet back, given paper blue shirt to wear home. All other clothing worn home. Pt verbalized understanding of discharge instructions

## 2018-07-19 ENCOUNTER — Encounter (HOSPITAL_BASED_OUTPATIENT_CLINIC_OR_DEPARTMENT_OTHER): Payer: Self-pay | Admitting: Emergency Medicine

## 2018-07-25 ENCOUNTER — Emergency Department (HOSPITAL_BASED_OUTPATIENT_CLINIC_OR_DEPARTMENT_OTHER)
Admission: EM | Admit: 2018-07-25 | Discharge: 2018-07-25 | Disposition: A | Discharge status: Home / Self Care | Payer: Medicaid Other | Attending: Emergency Medicine | Admitting: Emergency Medicine

## 2018-07-25 ENCOUNTER — Other Ambulatory Visit: Payer: Self-pay

## 2018-07-25 ENCOUNTER — Encounter (HOSPITAL_BASED_OUTPATIENT_CLINIC_OR_DEPARTMENT_OTHER): Payer: Self-pay | Admitting: *Deleted

## 2018-07-25 ENCOUNTER — Emergency Department (HOSPITAL_BASED_OUTPATIENT_CLINIC_OR_DEPARTMENT_OTHER): Payer: Medicaid Other

## 2018-07-25 DIAGNOSIS — Z7984 Long term (current) use of oral hypoglycemic drugs: Secondary | ICD-10-CM | POA: Diagnosis not present

## 2018-07-25 DIAGNOSIS — S06349D Traumatic hemorrhage of right cerebrum with loss of consciousness of unspecified duration, subsequent encounter: Secondary | ICD-10-CM | POA: Diagnosis not present

## 2018-07-25 DIAGNOSIS — Z79899 Other long term (current) drug therapy: Secondary | ICD-10-CM | POA: Insufficient documentation

## 2018-07-25 DIAGNOSIS — Z87891 Personal history of nicotine dependence: Secondary | ICD-10-CM | POA: Insufficient documentation

## 2018-07-25 DIAGNOSIS — E1140 Type 2 diabetes mellitus with diabetic neuropathy, unspecified: Secondary | ICD-10-CM | POA: Insufficient documentation

## 2018-07-25 DIAGNOSIS — S06319D Contusion and laceration of right cerebrum with loss of consciousness of unspecified duration, subsequent encounter: Secondary | ICD-10-CM

## 2018-07-25 DIAGNOSIS — R42 Dizziness and giddiness: Secondary | ICD-10-CM | POA: Diagnosis present

## 2018-07-25 LAB — BASIC METABOLIC PANEL
Anion gap: 6 (ref 5–15)
BUN: 23 mg/dL — ABNORMAL HIGH (ref 6–20)
CO2: 25 mmol/L (ref 22–32)
Calcium: 8.9 mg/dL (ref 8.9–10.3)
Chloride: 103 mmol/L (ref 98–111)
Creatinine, Ser: 1.54 mg/dL — ABNORMAL HIGH (ref 0.61–1.24)
GFR calc Af Amer: 57 mL/min — ABNORMAL LOW (ref >60)
GFR calc non Af Amer: 49 mL/min — ABNORMAL LOW (ref >60)
Glucose, Bld: 284 mg/dL — ABNORMAL HIGH (ref 70–99)
Potassium: 4.2 mmol/L (ref 3.5–5.1)
Sodium: 134 mmol/L — ABNORMAL LOW (ref 135–145)

## 2018-07-25 LAB — CBC WITH DIFFERENTIAL/PLATELET
Abs Immature Granulocytes: 0.02 10*3/uL (ref 0.00–0.07)
Basophils Absolute: 0 10*3/uL (ref 0.0–0.1)
Basophils Relative: 0 %
Eosinophils Absolute: 0.1 10*3/uL (ref 0.0–0.5)
Eosinophils Relative: 2 %
HCT: 39.7 % (ref 39.0–52.0)
Hemoglobin: 13.3 g/dL (ref 13.0–17.0)
Immature Granulocytes: 0 %
Lymphocytes Relative: 28 %
Lymphs Abs: 1.6 10*3/uL (ref 0.7–4.0)
MCH: 31.7 pg (ref 26.0–34.0)
MCHC: 33.5 g/dL (ref 30.0–36.0)
MCV: 94.5 fL (ref 80.0–100.0)
Monocytes Absolute: 0.5 10*3/uL (ref 0.1–1.0)
Monocytes Relative: 8 %
Neutro Abs: 3.5 10*3/uL (ref 1.7–7.7)
Neutrophils Relative %: 62 %
Platelets: 211 10*3/uL (ref 150–400)
RBC: 4.2 MIL/uL — ABNORMAL LOW (ref 4.22–5.81)
RDW: 12.1 % (ref 11.5–15.5)
WBC: 5.8 10*3/uL (ref 4.0–10.5)
nRBC: 0 % (ref 0.0–0.2)

## 2018-07-25 LAB — CBG MONITORING, ED: Glucose-Capillary: 259 mg/dL — ABNORMAL HIGH (ref 70–99)

## 2018-07-25 MED ORDER — MECLIZINE HCL 25 MG PO TABS: 25.0000 mg | ORAL_TABLET | Freq: Two times a day (BID) | ORAL | 0 refills | Status: DC | PRN

## 2018-07-25 MED ORDER — SODIUM CHLORIDE 0.9 % IV BOLUS
1000.0000 mL | Freq: Once | INTRAVENOUS | Status: AC
  Administered 2018-07-25: 1000 mL via INTRAVENOUS

## 2018-07-25 MED ORDER — MECLIZINE HCL 25 MG PO TABS
25.0000 mg | ORAL_TABLET | Freq: Once | ORAL | Status: AC
  Administered 2018-07-25: 25 mg via ORAL
  Filled 2018-07-25: qty 1

## 2018-07-25 MED ORDER — LISINOPRIL 10 MG PO TABS
20.0000 mg | ORAL_TABLET | Freq: Once | ORAL | Status: AC
  Administered 2018-07-25: 20 mg via ORAL
  Filled 2018-07-25: qty 2

## 2018-07-25 MED ORDER — HYDROCHLOROTHIAZIDE 25 MG PO TABS
25.0000 mg | ORAL_TABLET | Freq: Once | ORAL | Status: AC
  Administered 2018-07-25: 25 mg via ORAL
  Filled 2018-07-25: qty 1

## 2018-07-25 NOTE — ED Notes (Signed)
Pt on monitor 

## 2018-07-25 NOTE — ED Triage Notes (Signed)
Patient stated that when he stands up, the room starts spinning.  Complain of pain to right side of his neck.

## 2018-07-25 NOTE — Discharge Instructions (Addendum)
Do NOT take clopidogrel/Plavix for the next 1 week.  You need to call Dr. Lovell Sheehan and have a visit with him before going back on this medicine.  It is okay to continue to take your 81 mg baby aspirin.  If you develop new or worsening headache, weakness or numbness in your extremities, blurry vision, vomiting, trouble walking, or any other new/concerning symptoms then return to the ER for evaluation.  It is important to maintain good blood pressure during this time and to take your medicines as prescribed.
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
## 2018-07-25 NOTE — ED Notes (Signed)
ED Provider at bedside. 

## 2020-03-11 ENCOUNTER — Encounter (HOSPITAL_BASED_OUTPATIENT_CLINIC_OR_DEPARTMENT_OTHER): Payer: Self-pay

## 2020-03-11 ENCOUNTER — Emergency Department (HOSPITAL_BASED_OUTPATIENT_CLINIC_OR_DEPARTMENT_OTHER)
Admission: EM | Admit: 2020-03-11 | Discharge: 2020-03-11 | Disposition: A | Discharge status: Home / Self Care | Payer: Medicaid Other | Attending: Emergency Medicine | Admitting: Emergency Medicine

## 2020-03-11 ENCOUNTER — Other Ambulatory Visit: Payer: Self-pay

## 2020-03-11 DIAGNOSIS — E1140 Type 2 diabetes mellitus with diabetic neuropathy, unspecified: Secondary | ICD-10-CM | POA: Insufficient documentation

## 2020-03-11 DIAGNOSIS — Z794 Long term (current) use of insulin: Secondary | ICD-10-CM | POA: Insufficient documentation

## 2020-03-11 DIAGNOSIS — R519 Headache, unspecified: Secondary | ICD-10-CM | POA: Diagnosis present

## 2020-03-11 DIAGNOSIS — Z7984 Long term (current) use of oral hypoglycemic drugs: Secondary | ICD-10-CM | POA: Diagnosis not present

## 2020-03-11 DIAGNOSIS — Z79899 Other long term (current) drug therapy: Secondary | ICD-10-CM | POA: Diagnosis not present

## 2020-03-11 DIAGNOSIS — Z87891 Personal history of nicotine dependence: Secondary | ICD-10-CM | POA: Insufficient documentation

## 2020-03-11 DIAGNOSIS — Z955 Presence of coronary angioplasty implant and graft: Secondary | ICD-10-CM | POA: Insufficient documentation

## 2020-03-11 DIAGNOSIS — J019 Acute sinusitis, unspecified: Secondary | ICD-10-CM | POA: Diagnosis not present

## 2020-03-11 DIAGNOSIS — I2510 Atherosclerotic heart disease of native coronary artery without angina pectoris: Secondary | ICD-10-CM | POA: Diagnosis not present

## 2020-03-11 DIAGNOSIS — B9689 Other specified bacterial agents as the cause of diseases classified elsewhere: Secondary | ICD-10-CM

## 2020-03-11 MED ORDER — TETRACAINE HCL 0.5 % OP SOLN
1.0000 [drp] | Freq: Once | OPHTHALMIC | Status: AC
  Administered 2020-03-11: 1 [drp] via OPHTHALMIC
  Filled 2020-03-11: qty 4

## 2020-03-11 MED ORDER — ACETAMINOPHEN 325 MG PO TABS
650.0000 mg | ORAL_TABLET | Freq: Once | ORAL | Status: AC
  Administered 2020-03-11: 650 mg via ORAL
  Filled 2020-03-11: qty 2

## 2020-03-11 MED ORDER — DOXYCYCLINE HYCLATE 100 MG PO CAPS: 100.0000 mg | ORAL_CAPSULE | Freq: Two times a day (BID) | ORAL | 0 refills | Status: AC

## 2020-03-11 NOTE — Discharge Instructions (Addendum)
You were evaluated in the emergency department today for your left-sided facial pain.  Your physical exam and vital signs were very reassuring.  It appears that you have a bacterial infection in your sinuses, called sinusitis.  You have been prescribed an antibiotic called doxycycline; is important that you take the entire course of antibiotics for the next 7 days.  Please be aware that this antibiotic make you more sensitive to the sun.  Follow-up with your primary care doctor next week for reevaluation of your symptoms.  You may utilize Tylenol as needed at home for your discomfort.  Please return the emergency department if you develop blurry vision in your left eye, your symptoms worsen or fail to improve despite antibiotic therapy, or if you develop any other new severe symptoms.

## 2020-03-11 NOTE — ED Provider Notes (Signed)
[facility] EMERGENCY DEPARTMENT Provider Note   CSN: [id number] Arrival date & time: 03/11/20  1033     History Chief Complaint  Patient presents with  . Facial Pain    Eddie Foster is a 59 y.o. male who presents with 3 days of left facial pain and pressure, drainage from the eye, nasal congestion.  Endorses drainage from the left eye that is sticky and clear to yellow in color.  Worse in the mornings, but does continue to drain throughout the day.  He endorses tenderness beneath the left eye, left temple, and the left forehead.  He endorses thick clear to yellow discharge from the left nostril with scant streaks of blood. Patient states similar episode 2 months ago.  Patient denies sore throat, cough, chest pain, shortness of breath, abdominal pain, nausea, vomiting, diarrhea.  Denies blurred vision, double vision, photosensitivity.   I personally reviewed this patient's medical records.  He has history of diabetes type 2, arthritis, coronary artery disease with stent.  HPI     Past Medical History:  Diagnosis Date  . Arthritis   . Diabetes mellitus without complication (HCC)   . Neuropathy     There are no problems to display for this patient.   Past Surgical History:  Procedure Laterality Date  . CORONARY STENT PLACEMENT      History reviewed. No pertinent family history.  Social History   Tobacco Use  . Smoking status: Former Smoker    Types: Cigarettes  . Smokeless tobacco: Never Used  Substance Use Topics  . Alcohol use: Yes    Comment: drinks daily  . Drug use: Not Currently    Home Medications Prior to Admission medications   Medication Sig Start Date End Date Taking? Authorizing Provider  carvedilol (COREG) 6.25 MG tablet Take by mouth. 06/21/18  Yes [provider]  clopidogrel (PLAVIX) 75 MG tablet Take by mouth. 06/21/18  Yes [provider]  furosemide (LASIX) 40 MG tablet Take by mouth. 05/26/19  Yes [provider]  insulin aspart protamine- aspart (NOVOLOG MIX 70/30) (70-30) 100 UNIT/ML injection Inject into the skin.   Yes [provider]  lisinopril-hydrochlorothiazide (ZESTORETIC) 20-12.5 MG tablet Take 1 tablet by mouth daily. 10/10/19  Yes [provider]  atorvastatin (LIPITOR) 20 MG tablet Take by mouth.    [provider]  doxycycline (VIBRAMYCIN) 100 MG capsule Take 1 capsule (100 mg total) by mouth 2 (two) times daily for 7 days. 03/11/20 03/18/20  Posey Petrik, Lupe Carney R, PA-C  GABAPENTIN PO Take by mouth.    [provider]  GLYBURIDE PO Take by mouth.    [provider]  HYDROcodone-acetaminophen (NORCO/VICODIN) 5-325 MG tablet Take 1-2 tablets every 6 hours as needed for severe pain 05/04/15   Renne Crigler, PA-C  HYDROcodone-acetaminophen (NORCO/VICODIN) 5-325 MG tablet Take 1 tablet by mouth every 4 (four) hours as needed. 07/17/18   Jacalyn Lefevre, MD  meclizine (ANTIVERT) 25 MG tablet Take 1 tablet (25 mg total) by mouth 2 (two) times daily as needed for dizziness. 07/25/18   Pricilla Loveless, MD  metFORMIN (GLUCOPHAGE) 500 MG tablet Take 1,000 mg by mouth 2 (two) times daily with a meal.     [provider]  methocarbamol (ROBAXIN) 500 MG tablet Take 2 tablets (1,000 mg total) by mouth 4 (four) times daily. 05/04/15   Renne Crigler, PA-C  naproxen (NAPROSYN) 500 MG tablet Take 1 tablet (500 mg total) by mouth 2 (two) times daily. 11/22/14  Hess, Robyn M, PA-C  ondansetron (ZOFRAN ODT) 4 MG disintegrating tablet Take 1 tablet (4 mg total) by mouth every 8 (eight) hours as needed. 07/17/18   Jacalyn Lefevre, MD  Sertraline HCl (ZOLOFT PO) Take by mouth.    [provider]    Allergies    Patient has no known allergies.  Review of Systems   Review of Systems  Constitutional: Negative for activity change, appetite change, chills, fatigue and fever.  HENT: Positive for congestion, facial swelling, sinus pressure and  sinus pain. Negative for ear discharge, ear pain, sore throat, trouble swallowing and voice change.   Eyes: Positive for discharge. Negative for photophobia, pain, redness, itching and visual disturbance.  Respiratory: Negative for chest tightness, shortness of breath and wheezing.   Cardiovascular: Negative for chest pain, palpitations and leg swelling.  Gastrointestinal: Negative for abdominal pain, nausea and vomiting.  Endocrine: Negative.   Genitourinary: Negative.   Musculoskeletal: Negative.  Negative for neck pain and neck stiffness.  Skin: Negative.   Allergic/Immunologic: Positive for immunocompromised state.       DMT2  Neurological: Positive for headaches. Negative for dizziness, syncope, facial asymmetry, weakness and light-headedness.  Hematological: Negative.     Physical Exam Updated Vital Signs BP (!) 168/88 (BP Location: Right Arm)   Pulse 78   Temp 98.1 F (36.7 C) (Oral)   Resp 18   Ht 5\' 9"  (1.753 m)   Wt 90.7 kg   SpO2 100%   BMI 29.53 kg/m   Physical Exam Vitals and nursing note reviewed.  HENT:     Head: Normocephalic and atraumatic.      Right Ear: Hearing and external ear normal.     Left Ear: Hearing and external ear normal.     Nose: Nose normal.     Mouth/Throat:     Mouth: Mucous membranes are moist.     Pharynx: Oropharynx is clear. Uvula midline. No oropharyngeal exudate or posterior oropharyngeal erythema.  Eyes:     General: Lids are normal. Vision grossly intact. Gaze aligned appropriately.        Right eye: Discharge present.        Left eye: No discharge.     Intraocular pressure: Left eye pressure is 11 mmHg. Measurements were taken using a handheld tonometer.    Extraocular Movements: Extraocular movements intact.     Conjunctiva/sclera: Conjunctivae normal.     Pupils: Pupils are equal, round, and reactive to light.      Comments: No eye pain with EOMs  Neck:     Trachea: Trachea and phonation normal.  Cardiovascular:      Rate and Rhythm: Normal rate and regular rhythm.     Pulses: Normal pulses.          Radial pulses are 2+ on the right side and 2+ on the left side.       Dorsalis pedis pulses are 2+ on the right side and 2+ on the left side.     Heart sounds: Normal heart sounds. No murmur heard.   Pulmonary:     Effort: Pulmonary effort is normal. No respiratory distress.     Breath sounds: Normal breath sounds. No wheezing or rales.  Chest:     Chest wall: No mass, lacerations, tenderness, crepitus or edema.  Abdominal:     General: Bowel sounds are normal. There is no distension.     Palpations: Abdomen is soft.     Tenderness: There is no abdominal tenderness.  Musculoskeletal:  General: No deformity.     Cervical back: Neck supple. No rigidity.     Right lower leg: No edema.     Left lower leg: No edema.  Lymphadenopathy:     Cervical: No cervical adenopathy.  Skin:    General: Skin is warm and dry.     Capillary Refill: Capillary refill takes less than 2 seconds.     Comments: No rash or skin changes suggestive of herpes zoster   Neurological:     General: No focal deficit present.     Mental Status: He is alert and oriented to person, place, and time.  Psychiatric:        Mood and Affect: Mood normal.    ED Results / Procedures / Treatments   Labs (all labs ordered are listed, but only abnormal results are displayed) Labs Reviewed - No data to display  EKG None  Radiology No results found.  Procedures Procedures (including critical care time)  Medications Ordered in ED Medications  tetracaine (PONTOCAINE) 0.5 % ophthalmic solution 1 drop (1 drop Left Eye Given 03/11/20 1210)  acetaminophen (TYLENOL) tablet 650 mg (650 mg Oral Given 03/11/20 1210)    ED Course  I have reviewed the triage vital signs and the nursing notes.  Pertinent labs & imaging results that were available during my care of the patient were reviewed by me and considered in my medical decision  making (see chart for details).    MDM Rules/Calculators/A&P                          59 year old male who presents with concern for left eye drainage, left facial pressure and pain, thick drainage from the left nostril x3 days.  DDx for this patient includes but is not limited to URI, sinusitis, migraine, tension headache, cluster headache, preseptal cellulitis, orbital cellulitis, temporal arteritis, trigeminal neuralgia, herpes zoster oticus.   Hypertensive on intake 169/90.  Vital signs otherwise normal.  Physical exam significant for left maxillary sinus tenderness to palpation, rhinorrhea, drainage from the left eye.  Left-sided intraocular pressure assessed using Tono-Pen at the bedside; normal 11 mmHg.  Patient also seen by attending physician Dr. Rubin Payor at the bedside.  Feel that this patient's presentation is most consistent with acute rhinosinusitis.  Given severity of patient's symptoms will prescribe antibiotic therapy to cover for bacterial component.   Recommend close follow-up with PCP, may utilize Tylenol as needed at home for discomfort.  He voiced understanding of his medical evaluation and treatment plan.  Each of his questions were answered to his expressed satisfaction.  Return precautions were given.  Patient is stable for discharge.  Final Clinical Impression(s) / ED Diagnoses Final diagnoses:  Acute bacterial sinusitis    Rx / DC Orders ED Discharge Orders         Ordered    doxycycline (VIBRAMYCIN) 100 MG capsule  2 times daily        03/11/20 [address] [place]elia Salm 03/11/20 2125    Benjiman Core, MD 03/12/20 1605

## 2020-03-11 NOTE — ED Triage Notes (Signed)
Pt complaining of pain to left temporal head and left sinus. States has sinus pressure and some swelling under left eye with some runny nose.

## 2020-10-30 ENCOUNTER — Encounter (HOSPITAL_BASED_OUTPATIENT_CLINIC_OR_DEPARTMENT_OTHER): Payer: Self-pay

## 2020-10-30 ENCOUNTER — Emergency Department (HOSPITAL_BASED_OUTPATIENT_CLINIC_OR_DEPARTMENT_OTHER): Payer: Medicaid Other

## 2020-10-30 ENCOUNTER — Other Ambulatory Visit: Payer: Self-pay

## 2020-10-30 ENCOUNTER — Inpatient Hospital Stay (HOSPITAL_BASED_OUTPATIENT_CLINIC_OR_DEPARTMENT_OTHER)
Admission: EM | Admit: 2020-10-30 | Discharge: 2020-11-02 | DRG: 291 | Disposition: A | Discharge status: Home / Self Care | Payer: Medicaid Other | Attending: Internal Medicine | Admitting: Internal Medicine

## 2020-10-30 DIAGNOSIS — Z8249 Family history of ischemic heart disease and other diseases of the circulatory system: Secondary | ICD-10-CM

## 2020-10-30 DIAGNOSIS — E785 Hyperlipidemia, unspecified: Secondary | ICD-10-CM | POA: Diagnosis present

## 2020-10-30 DIAGNOSIS — N1832 Chronic kidney disease, stage 3b: Secondary | ICD-10-CM | POA: Diagnosis present

## 2020-10-30 DIAGNOSIS — F1010 Alcohol abuse, uncomplicated: Secondary | ICD-10-CM | POA: Diagnosis present

## 2020-10-30 DIAGNOSIS — N183 Chronic kidney disease, stage 3 unspecified: Secondary | ICD-10-CM | POA: Diagnosis present

## 2020-10-30 DIAGNOSIS — I428 Other cardiomyopathies: Secondary | ICD-10-CM | POA: Diagnosis present

## 2020-10-30 DIAGNOSIS — Z79899 Other long term (current) drug therapy: Secondary | ICD-10-CM

## 2020-10-30 DIAGNOSIS — E1122 Type 2 diabetes mellitus with diabetic chronic kidney disease: Secondary | ICD-10-CM | POA: Diagnosis present

## 2020-10-30 DIAGNOSIS — I509 Heart failure, unspecified: Secondary | ICD-10-CM

## 2020-10-30 DIAGNOSIS — I5043 Acute on chronic combined systolic (congestive) and diastolic (congestive) heart failure: Secondary | ICD-10-CM | POA: Diagnosis present

## 2020-10-30 DIAGNOSIS — I248 Other forms of acute ischemic heart disease: Secondary | ICD-10-CM | POA: Diagnosis present

## 2020-10-30 DIAGNOSIS — Z955 Presence of coronary angioplasty implant and graft: Secondary | ICD-10-CM

## 2020-10-30 DIAGNOSIS — E1165 Type 2 diabetes mellitus with hyperglycemia: Secondary | ICD-10-CM | POA: Diagnosis present

## 2020-10-30 DIAGNOSIS — Z91013 Allergy to seafood: Secondary | ICD-10-CM

## 2020-10-30 DIAGNOSIS — D649 Anemia, unspecified: Secondary | ICD-10-CM | POA: Diagnosis present

## 2020-10-30 DIAGNOSIS — Z87891 Personal history of nicotine dependence: Secondary | ICD-10-CM

## 2020-10-30 DIAGNOSIS — F1410 Cocaine abuse, uncomplicated: Secondary | ICD-10-CM | POA: Diagnosis present

## 2020-10-30 DIAGNOSIS — I130 Hypertensive heart and chronic kidney disease with heart failure and stage 1 through stage 4 chronic kidney disease, or unspecified chronic kidney disease: Principal | ICD-10-CM | POA: Diagnosis present

## 2020-10-30 DIAGNOSIS — Z833 Family history of diabetes mellitus: Secondary | ICD-10-CM

## 2020-10-30 DIAGNOSIS — D631 Anemia in chronic kidney disease: Secondary | ICD-10-CM | POA: Diagnosis present

## 2020-10-30 DIAGNOSIS — E1129 Type 2 diabetes mellitus with other diabetic kidney complication: Secondary | ICD-10-CM | POA: Diagnosis present

## 2020-10-30 DIAGNOSIS — I2510 Atherosclerotic heart disease of native coronary artery without angina pectoris: Secondary | ICD-10-CM | POA: Diagnosis present

## 2020-10-30 DIAGNOSIS — E875 Hyperkalemia: Secondary | ICD-10-CM | POA: Diagnosis present

## 2020-10-30 DIAGNOSIS — R0602 Shortness of breath: Secondary | ICD-10-CM

## 2020-10-30 DIAGNOSIS — Z7902 Long term (current) use of antithrombotics/antiplatelets: Secondary | ICD-10-CM

## 2020-10-30 DIAGNOSIS — I255 Ischemic cardiomyopathy: Secondary | ICD-10-CM | POA: Diagnosis present

## 2020-10-30 DIAGNOSIS — E1140 Type 2 diabetes mellitus with diabetic neuropathy, unspecified: Secondary | ICD-10-CM | POA: Diagnosis present

## 2020-10-30 DIAGNOSIS — Z20822 Contact with and (suspected) exposure to covid-19: Secondary | ICD-10-CM | POA: Diagnosis present

## 2020-10-30 DIAGNOSIS — Z8619 Personal history of other infectious and parasitic diseases: Secondary | ICD-10-CM

## 2020-10-30 DIAGNOSIS — Z794 Long term (current) use of insulin: Secondary | ICD-10-CM

## 2020-10-30 HISTORY — DX: Atherosclerotic heart disease of native coronary artery without angina pectoris: I25.10

## 2020-10-30 HISTORY — DX: Chronic kidney disease, stage 3 unspecified: N18.30

## 2020-10-30 HISTORY — DX: Chronic combined systolic (congestive) and diastolic (congestive) heart failure: I50.42

## 2020-10-30 HISTORY — DX: Anemia, unspecified: D64.9

## 2020-10-30 HISTORY — DX: Essential (primary) hypertension: I10

## 2020-10-30 LAB — COMPREHENSIVE METABOLIC PANEL
ALT: 12 U/L (ref 0–44)
AST: 14 U/L — ABNORMAL LOW (ref 15–41)
Albumin: 2.9 g/dL — ABNORMAL LOW (ref 3.5–5.0)
Alkaline Phosphatase: 71 U/L (ref 38–126)
Anion gap: 7 (ref 5–15)
BUN: 20 mg/dL (ref 6–20)
CO2: 23 mmol/L (ref 22–32)
Calcium: 8.8 mg/dL — ABNORMAL LOW (ref 8.9–10.3)
Chloride: 108 mmol/L (ref 98–111)
Creatinine, Ser: 1.76 mg/dL — ABNORMAL HIGH (ref 0.61–1.24)
GFR, Estimated: 44 mL/min — ABNORMAL LOW (ref >60)
Glucose, Bld: 195 mg/dL — ABNORMAL HIGH (ref 70–99)
Potassium: 5.3 mmol/L — ABNORMAL HIGH (ref 3.5–5.1)
Sodium: 138 mmol/L (ref 135–145)
Total Bilirubin: 0.4 mg/dL (ref 0.3–1.2)
Total Protein: 6.1 g/dL — ABNORMAL LOW (ref 6.5–8.1)

## 2020-10-30 LAB — CBC WITH DIFFERENTIAL/PLATELET
Abs Immature Granulocytes: 0.03 10*3/uL (ref 0.00–0.07)
Basophils Absolute: 0 10*3/uL (ref 0.0–0.1)
Basophils Relative: 0 %
Eosinophils Absolute: 0.1 10*3/uL (ref 0.0–0.5)
Eosinophils Relative: 1 %
HCT: 34.9 % — ABNORMAL LOW (ref 39.0–52.0)
Hemoglobin: 11.7 g/dL — ABNORMAL LOW (ref 13.0–17.0)
Immature Granulocytes: 0 %
Lymphocytes Relative: 14 %
Lymphs Abs: 1.2 10*3/uL (ref 0.7–4.0)
MCH: 32.3 pg (ref 26.0–34.0)
MCHC: 33.5 g/dL (ref 30.0–36.0)
MCV: 96.4 fL (ref 80.0–100.0)
Monocytes Absolute: 0.8 10*3/uL (ref 0.1–1.0)
Monocytes Relative: 10 %
Neutro Abs: 6.2 10*3/uL (ref 1.7–7.7)
Neutrophils Relative %: 75 %
Platelets: 211 10*3/uL (ref 150–400)
RBC: 3.62 MIL/uL — ABNORMAL LOW (ref 4.22–5.81)
RDW: 13.8 % (ref 11.5–15.5)
WBC: 8.3 10*3/uL (ref 4.0–10.5)
nRBC: 0 % (ref 0.0–0.2)

## 2020-10-30 LAB — RESP PANEL BY RT-PCR (FLU A&B, COVID) ARPGX2
Influenza A by PCR: NEGATIVE
Influenza B by PCR: NEGATIVE
SARS Coronavirus 2 by RT PCR: NEGATIVE

## 2020-10-30 LAB — BRAIN NATRIURETIC PEPTIDE: B Natriuretic Peptide: 1201.8 pg/mL — ABNORMAL HIGH (ref 0.0–100.0)

## 2020-10-30 LAB — TROPONIN I (HIGH SENSITIVITY)
Troponin I (High Sensitivity): 47 ng/L — ABNORMAL HIGH (ref <18)
Troponin I (High Sensitivity): 47 ng/L — ABNORMAL HIGH (ref <18)

## 2020-10-30 LAB — LIPASE, BLOOD: Lipase: 25 U/L (ref 11–51)

## 2020-10-30 LAB — MAGNESIUM: Magnesium: 1.6 mg/dL — ABNORMAL LOW (ref 1.7–2.4)

## 2020-10-30 MED ORDER — FENTANYL CITRATE (PF) 100 MCG/2ML IJ SOLN
50.0000 ug | INTRAMUSCULAR | Status: AC | PRN
Start: 2020-10-30 — End: 2020-10-31
  Administered 2020-10-30 – 2020-10-31 (×2): 50 ug via INTRAVENOUS
  Filled 2020-10-30 (×2): qty 2

## 2020-10-30 MED ORDER — NITROGLYCERIN 2 % TD OINT
0.5000 [in_us] | TOPICAL_OINTMENT | Freq: Once | TRANSDERMAL | Status: AC
  Administered 2020-10-30: 0.5 [in_us] via TOPICAL
  Filled 2020-10-30: qty 1

## 2020-10-30 MED ORDER — ACETAMINOPHEN 325 MG PO TABS
650.0000 mg | ORAL_TABLET | Freq: Once | ORAL | Status: AC
  Administered 2020-10-30: 650 mg via ORAL
  Filled 2020-10-30: qty 2

## 2020-10-30 MED ORDER — FUROSEMIDE 10 MG/ML IJ SOLN
40.0000 mg | Freq: Once | INTRAMUSCULAR | Status: AC
  Administered 2020-10-30: 40 mg via INTRAVENOUS
  Filled 2020-10-30: qty 4

## 2020-10-30 NOTE — ED Provider Notes (Signed)
[facility] EMERGENCY DEPARTMENT Provider Note   CSN: [id number] Arrival date & time: 10/30/20  2007     History Chief Complaint  Patient presents with   Cough   Shortness of Breath    Abelino Tippin is a 60 y.o. male.  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  He has not taken any medications for his discomfort.  He states that he has some epigastric abdominal pain denies any lightheadedness or dizziness.  States he is vaccinated for COVID x2 has not gotten a booster.  States that he feels quite short of breath when he lays down.  Seems to get bad enough that he is tachypneic.  Denies any chest pain at all.  He denies any leg swelling unilateral or bilateral.     Past Medical History:  Diagnosis Date   Arthritis    Diabetes mellitus without complication (HCC)    Neuropathy     There are no problems to display for this patient.   Past Surgical History:  Procedure Laterality Date   CORONARY STENT PLACEMENT         No family history on file.  Social History   Tobacco Use   Smoking status: Some Days    Types: Cigarettes    Smokeless tobacco: Never  Substance Use Topics   Alcohol use: Yes    Comment: drinks daily   Drug use: Not Currently    Home Medications Prior to Admission medications   Medication Sig Start Date End Date Taking? Authorizing Provider  atorvastatin (LIPITOR) 20 MG tablet Take by mouth.    [provider]  carvedilol (COREG) 6.25 MG tablet Take by mouth. 06/21/18   [provider]  clopidogrel (PLAVIX) 75 MG tablet Take by mouth. 06/21/18   [provider]  furosemide (LASIX) 40 MG tablet Take by mouth. 05/26/19   [provider]  GABAPENTIN PO Take by mouth.    [provider]  GLYBURIDE PO Take by mouth.    [provider]  HYDROcodone-acetaminophen (NORCO/VICODIN) 5-325 MG tablet Take 1-2 tablets every 6 hours as needed for severe pain 05/04/15   Renne Crigler, PA-C  HYDROcodone-acetaminophen (NORCO/VICODIN) 5-325 MG tablet Take 1 tablet by mouth every 4 (four) hours as needed. 07/17/18   Jacalyn Lefevre, MD  insulin aspart protamine- aspart (NOVOLOG MIX 70/30) (70-30) 100 UNIT/ML injection Inject into the skin.    [provider]  lisinopril-hydrochlorothiazide (ZESTORETIC) 20-12.5 MG tablet Take 1 tablet by mouth daily. 10/10/19   [provider]  meclizine (ANTIVERT) 25 MG tablet Take 1 tablet (25 mg total)  by mouth 2 (two) times daily as needed for dizziness. 07/25/18   Pricilla Loveless, MD  metFORMIN (GLUCOPHAGE) 500 MG tablet Take 1,000 mg by mouth 2 (two) times daily with a meal.     [provider]  methocarbamol (ROBAXIN) 500 MG tablet Take 2 tablets (1,000 mg total) by mouth 4 (four) times daily. 05/04/15   Renne Crigler, PA-C  naproxen (NAPROSYN) 500 MG tablet Take 1 tablet (500 mg total) by mouth 2 (two) times daily. 11/22/14   Hess, Melina Schools M, PA-C  ondansetron (ZOFRAN ODT) 4 MG disintegrating tablet Take 1 tablet (4 mg total) by mouth every 8 (eight) hours as needed. 07/17/18   Jacalyn Lefevre, MD  Sertraline HCl  (ZOLOFT PO) Take by mouth.    [provider]    Allergies    Patient has no known allergies.  Review of Systems   Review of Systems  Constitutional:  Positive for fatigue. Negative for chills and fever.  HENT:  Negative for congestion.   Eyes:  Negative for pain.  Respiratory:  Positive for cough and shortness of breath.   Cardiovascular:  Negative for chest pain and leg swelling.  Gastrointestinal:  Negative for abdominal pain and vomiting.  Genitourinary:  Negative for dysuria.  Musculoskeletal:  Positive for myalgias.  Skin:  Negative for rash.  Neurological:  Negative for dizziness and headaches.   Physical Exam Updated Vital Signs BP 131/84   Pulse 74   Temp 100.2 F (37.9 C) (Oral)   Resp (!) 23   Ht 5\' 9"  (1.753 m)   Wt 88.9 kg   SpO2 97%   BMI 28.94 kg/m   Physical Exam Vitals and nursing note reviewed.  Constitutional:      General: He is in acute distress.     Comments: Uncomfortable appearing 60 year old male  HENT:     Head: Normocephalic and atraumatic.     Nose: Nose normal.  Eyes:     General: No scleral icterus. Cardiovascular:     Rate and Rhythm: Normal rate and regular rhythm.     Pulses: Normal pulses.     Heart sounds: Normal heart sounds.  Pulmonary:     Effort: No respiratory distress.     Breath sounds: Rales present. No wheezing.     Comments: Tachypneic, and some mild increased work of breathing.  Speaking in full sentences however somewhat short sentences. Abdominal:     Palpations: Abdomen is soft.     Tenderness: There is no abdominal tenderness.  Musculoskeletal:     Cervical back: Normal range of motion.     Right lower leg: No edema.     Left lower leg: No edema.  Skin:    General: Skin is warm and dry.     Capillary Refill: Capillary refill takes less than 2 seconds.  Neurological:     Mental Status: He is alert. Mental status is at baseline.  Psychiatric:        Mood and Affect: Mood normal.        Behavior:  Behavior normal.    ED Results / Procedures / Treatments   Labs (all labs ordered are listed, but only abnormal results are displayed) Labs Reviewed  CBC WITH DIFFERENTIAL/PLATELET - Abnormal; Notable for the following components:      Result Value   RBC 3.62 (*)    Hemoglobin 11.7 (*)    HCT 34.9 (*)    All other components within normal limits  COMPREHENSIVE METABOLIC PANEL - Abnormal; Notable  for the following components:   Potassium 5.3 (*)    Glucose, Bld 195 (*)    Creatinine, Ser 1.76 (*)    Calcium 8.8 (*)    Total Protein 6.1 (*)    Albumin 2.9 (*)    AST 14 (*)    GFR, Estimated 44 (*)    All other components within normal limits  BRAIN NATRIURETIC PEPTIDE - Abnormal; Notable for the following components:   B Natriuretic Peptide 1,201.8 (*)    All other components within normal limits  TROPONIN I (HIGH SENSITIVITY) - Abnormal; Notable for the following components:   Troponin I (High Sensitivity) 47 (*)    All other components within normal limits  RESP PANEL BY RT-PCR (FLU A&B, COVID) ARPGX2  LIPASE, BLOOD  MAGNESIUM  TSH  TROPONIN I (HIGH SENSITIVITY)    EKG EKG Interpretation  Date/Time:  Wednesday October 30 2020 21:43:08 EDT Ventricular Rate:  77 PR Interval:  149 QRS Duration: 98 QT Interval:  399 QTC Calculation: 452 R Axis:   58 Text Interpretation: Age not entered, assumed to be  60 years old for purpose of ECG interpretation Sinus rhythm Left ventricular hypertrophy Nonspecific T abnormalities, inferior leads Confirmed by Tilden Fossa [phone]) on 10/30/2020 10:22:29 PM  Radiology DG Chest Port 1 View  Result Date: 10/30/2020 CLINICAL DATA:  Chest pain for 2 days EXAM: PORTABLE CHEST 1 VIEW COMPARISON:  07/16/2018 FINDINGS: Cardiac shadow is mildly enlarged but accentuated by the portable technique. Aortic calcifications are noted. The lungs are well aerated bilaterally. Mild vascular congestion is noted centrally without interstitial edema. No focal  infiltrate is noted. No acute bony abnormality is seen. IMPRESSION: Mild vascular congestion without interstitial edema. Electronically Signed   By: Alcide Clever M.D.   On: 10/30/2020 21:35    Procedures Procedures   Medications Ordered in ED Medications  fentaNYL (SUBLIMAZE) injection 50 mcg (has no administration in time range)  furosemide (LASIX) injection 40 mg (has no administration in time range)  nitroGLYCERIN (NITROGLYN) 2 % ointment 0.5 inch (has no administration in time range)  acetaminophen (TYLENOL) tablet 650 mg (650 mg Oral Given 10/30/20 2135)    ED Course  I have reviewed the triage vital signs and the nursing notes.  Pertinent labs & imaging results that were available during my care of the patient were reviewed by me and considered in my medical decision making (see chart for details).    MDM Rules/Calculators/A&P                          Patient is a 60 year old male with a past medical history significant for what he describes as shortness of breath.  He states that he was laying down moments ago and simply could not get a breath of air.  He states he was gasping for air came to the ER for evaluation  On my initial evaluation he is tachypneic and appears quite uncomfortable.  He has diffuse crackles no lower extremity edema of note.  Does not have any notable JVP.  CBC without leukocytosis or clinically significant anemia.  CMP with continued CKD creatinine actually somewhat improved from his last lab work.  Potassium marginally elevated at 5.3.  May be secondary to his kidney dysfunction.  Some significant T wave inversions notable on EKG with LVH.  Likely due to some heart failure.  Troponins flat at 47.  He is not having any chest pain.  BNP significantly elevated at 1201.  Lipase within normal list of pancreatitis.  TSH pending chest x-ray with some increased pulmonary vascular congestion.  Patient reevaluated he feels much improved after being sat upright and  given some Lasix and nitroglycerin on the skin.  He is also given some Tylenol he had a low-grade fever his troponin is not significantly elevated off to be consistent with myocarditis.  Discussed my attending physician Dr. Madilyn Hook.  Discussed with Dr. Rachael Darby of hospitalist service will admit to medicine. Appreciate the consultation and care of pt.    Final Clinical Impression(s) / ED Diagnoses Final diagnoses:  SOB (shortness of breath)    Rx / DC Orders ED Discharge Orders     None        Gailen Shelter, [place] 10/31/20 0009    Tilden Fossa, MD 11/01/20 1239

## 2020-10-30 NOTE — ED Triage Notes (Signed)
Pt c/o day 2 of flu like sx-NAD-steady gait

## 2020-10-31 ENCOUNTER — Inpatient Hospital Stay (HOSPITAL_COMMUNITY): Payer: Medicaid Other

## 2020-10-31 ENCOUNTER — Encounter (HOSPITAL_COMMUNITY): Payer: Self-pay | Admitting: Family Medicine

## 2020-10-31 DIAGNOSIS — N183 Chronic kidney disease, stage 3 unspecified: Secondary | ICD-10-CM | POA: Diagnosis present

## 2020-10-31 DIAGNOSIS — I509 Heart failure, unspecified: Secondary | ICD-10-CM | POA: Diagnosis not present

## 2020-10-31 DIAGNOSIS — R0602 Shortness of breath: Secondary | ICD-10-CM

## 2020-10-31 DIAGNOSIS — Z955 Presence of coronary angioplasty implant and graft: Secondary | ICD-10-CM | POA: Diagnosis not present

## 2020-10-31 DIAGNOSIS — I248 Other forms of acute ischemic heart disease: Secondary | ICD-10-CM | POA: Diagnosis present

## 2020-10-31 DIAGNOSIS — Z833 Family history of diabetes mellitus: Secondary | ICD-10-CM | POA: Diagnosis not present

## 2020-10-31 DIAGNOSIS — I5041 Acute combined systolic (congestive) and diastolic (congestive) heart failure: Secondary | ICD-10-CM | POA: Diagnosis not present

## 2020-10-31 DIAGNOSIS — I428 Other cardiomyopathies: Secondary | ICD-10-CM | POA: Diagnosis present

## 2020-10-31 DIAGNOSIS — Z7902 Long term (current) use of antithrombotics/antiplatelets: Secondary | ICD-10-CM | POA: Diagnosis not present

## 2020-10-31 DIAGNOSIS — E1122 Type 2 diabetes mellitus with diabetic chronic kidney disease: Secondary | ICD-10-CM | POA: Diagnosis present

## 2020-10-31 DIAGNOSIS — I130 Hypertensive heart and chronic kidney disease with heart failure and stage 1 through stage 4 chronic kidney disease, or unspecified chronic kidney disease: Secondary | ICD-10-CM | POA: Diagnosis not present

## 2020-10-31 DIAGNOSIS — I5021 Acute systolic (congestive) heart failure: Secondary | ICD-10-CM | POA: Diagnosis not present

## 2020-10-31 DIAGNOSIS — E1140 Type 2 diabetes mellitus with diabetic neuropathy, unspecified: Secondary | ICD-10-CM | POA: Diagnosis present

## 2020-10-31 DIAGNOSIS — I5043 Acute on chronic combined systolic (congestive) and diastolic (congestive) heart failure: Secondary | ICD-10-CM | POA: Diagnosis present

## 2020-10-31 DIAGNOSIS — D631 Anemia in chronic kidney disease: Secondary | ICD-10-CM | POA: Diagnosis present

## 2020-10-31 DIAGNOSIS — E1129 Type 2 diabetes mellitus with other diabetic kidney complication: Secondary | ICD-10-CM | POA: Diagnosis present

## 2020-10-31 DIAGNOSIS — N1832 Chronic kidney disease, stage 3b: Secondary | ICD-10-CM | POA: Diagnosis present

## 2020-10-31 DIAGNOSIS — Z79899 Other long term (current) drug therapy: Secondary | ICD-10-CM | POA: Diagnosis not present

## 2020-10-31 DIAGNOSIS — Z20822 Contact with and (suspected) exposure to covid-19: Secondary | ICD-10-CM | POA: Diagnosis present

## 2020-10-31 DIAGNOSIS — Z87891 Personal history of nicotine dependence: Secondary | ICD-10-CM | POA: Diagnosis not present

## 2020-10-31 DIAGNOSIS — I2510 Atherosclerotic heart disease of native coronary artery without angina pectoris: Secondary | ICD-10-CM | POA: Diagnosis present

## 2020-10-31 DIAGNOSIS — F1010 Alcohol abuse, uncomplicated: Secondary | ICD-10-CM | POA: Diagnosis present

## 2020-10-31 DIAGNOSIS — E875 Hyperkalemia: Secondary | ICD-10-CM | POA: Diagnosis present

## 2020-10-31 DIAGNOSIS — D649 Anemia, unspecified: Secondary | ICD-10-CM | POA: Diagnosis present

## 2020-10-31 DIAGNOSIS — Z8619 Personal history of other infectious and parasitic diseases: Secondary | ICD-10-CM | POA: Diagnosis not present

## 2020-10-31 DIAGNOSIS — I5030 Unspecified diastolic (congestive) heart failure: Secondary | ICD-10-CM

## 2020-10-31 DIAGNOSIS — F1410 Cocaine abuse, uncomplicated: Secondary | ICD-10-CM | POA: Diagnosis present

## 2020-10-31 DIAGNOSIS — Z91013 Allergy to seafood: Secondary | ICD-10-CM | POA: Diagnosis not present

## 2020-10-31 DIAGNOSIS — Z8249 Family history of ischemic heart disease and other diseases of the circulatory system: Secondary | ICD-10-CM | POA: Diagnosis not present

## 2020-10-31 DIAGNOSIS — E1165 Type 2 diabetes mellitus with hyperglycemia: Secondary | ICD-10-CM | POA: Diagnosis present

## 2020-10-31 DIAGNOSIS — E785 Hyperlipidemia, unspecified: Secondary | ICD-10-CM | POA: Diagnosis present

## 2020-10-31 LAB — GLUCOSE, CAPILLARY
Glucose-Capillary: 251 mg/dL — ABNORMAL HIGH (ref 70–99)
Glucose-Capillary: 207 mg/dL — ABNORMAL HIGH (ref 70–99)
Glucose-Capillary: 142 mg/dL — ABNORMAL HIGH (ref 70–99)

## 2020-10-31 LAB — ECHOCARDIOGRAM COMPLETE
Area-P 1/2: 3.61 cm2
Calc EF: 24.9 %
Height: 69 in
S' Lateral: 5.1 cm
Single Plane A2C EF: 21.7 %
Single Plane A4C EF: 25.6 %
Weight: 2982.4 oz

## 2020-10-31 LAB — BASIC METABOLIC PANEL
Anion gap: 7 (ref 5–15)
BUN: 22 mg/dL — ABNORMAL HIGH (ref 6–20)
CO2: 26 mmol/L (ref 22–32)
Calcium: 8.9 mg/dL (ref 8.9–10.3)
Chloride: 104 mmol/L (ref 98–111)
Creatinine, Ser: 1.66 mg/dL — ABNORMAL HIGH (ref 0.61–1.24)
GFR, Estimated: 47 mL/min — ABNORMAL LOW (ref >60)
Glucose, Bld: 260 mg/dL — ABNORMAL HIGH (ref 70–99)
Potassium: 4.9 mmol/L (ref 3.5–5.1)
Sodium: 137 mmol/L (ref 135–145)

## 2020-10-31 LAB — CBC
HCT: 38.5 % — ABNORMAL LOW (ref 39.0–52.0)
Hemoglobin: 12.3 g/dL — ABNORMAL LOW (ref 13.0–17.0)
MCH: 32 pg (ref 26.0–34.0)
MCHC: 31.9 g/dL (ref 30.0–36.0)
MCV: 100.3 fL — ABNORMAL HIGH (ref 80.0–100.0)
Platelets: 200 10*3/uL (ref 150–400)
RBC: 3.84 MIL/uL — ABNORMAL LOW (ref 4.22–5.81)
RDW: 14 % (ref 11.5–15.5)
WBC: 7.6 10*3/uL (ref 4.0–10.5)
nRBC: 0 % (ref 0.0–0.2)

## 2020-10-31 LAB — RAPID URINE DRUG SCREEN, HOSP PERFORMED
Amphetamines: NOT DETECTED
Barbiturates: NOT DETECTED
Benzodiazepines: NOT DETECTED
Cocaine: POSITIVE — AB
Opiates: NOT DETECTED
Tetrahydrocannabinol: NOT DETECTED

## 2020-10-31 LAB — CREATININE, SERUM
Creatinine, Ser: 1.74 mg/dL — ABNORMAL HIGH (ref 0.61–1.24)
GFR, Estimated: 44 mL/min — ABNORMAL LOW (ref >60)

## 2020-10-31 LAB — HIV ANTIBODY (ROUTINE TESTING W REFLEX): HIV Screen 4th Generation wRfx: NONREACTIVE

## 2020-10-31 LAB — HEMOGLOBIN A1C
Hgb A1c MFr Bld: 6.8 % — ABNORMAL HIGH (ref 4.8–5.6)
Mean Plasma Glucose: 148.46 mg/dL

## 2020-10-31 LAB — TSH
TSH: 0.865 u[IU]/mL (ref 0.350–4.500)
TSH: 1.378 u[IU]/mL (ref 0.350–4.500)

## 2020-10-31 LAB — D-DIMER, QUANTITATIVE: D-Dimer, Quant: 1.09 ug/mL-FEU — ABNORMAL HIGH (ref 0.00–0.50)

## 2020-10-31 MED ORDER — CLOPIDOGREL BISULFATE 75 MG PO TABS
75.0000 mg | ORAL_TABLET | Freq: Every day | ORAL | Status: DC
  Administered 2020-10-31 – 2020-11-02 (×3): 75 mg via ORAL
  Filled 2020-10-31 (×3): qty 1

## 2020-10-31 MED ORDER — LORAZEPAM 2 MG/ML IJ SOLN: 1.0000 mg | INTRAMUSCULAR | Status: DC | PRN

## 2020-10-31 MED ORDER — LISINOPRIL 20 MG PO TABS
20.0000 mg | ORAL_TABLET | Freq: Every day | ORAL | Status: DC
  Administered 2020-10-31: 20 mg via ORAL
  Filled 2020-10-31 (×2): qty 1

## 2020-10-31 MED ORDER — MAGNESIUM SULFATE 2 GM/50ML IV SOLN
2.0000 g | Freq: Once | INTRAVENOUS | Status: AC
  Administered 2020-10-31: 2 g via INTRAVENOUS
  Filled 2020-10-31: qty 50

## 2020-10-31 MED ORDER — THIAMINE HCL 100 MG PO TABS
100.0000 mg | ORAL_TABLET | Freq: Every day | ORAL | Status: DC
  Administered 2020-10-31 – 2020-11-02 (×3): 100 mg via ORAL
  Filled 2020-10-31 (×3): qty 1

## 2020-10-31 MED ORDER — ACETAMINOPHEN 325 MG PO TABS
650.0000 mg | ORAL_TABLET | Freq: Four times a day (QID) | ORAL | Status: DC | PRN
  Administered 2020-10-31 – 2020-11-02 (×4): 650 mg via ORAL
  Filled 2020-10-31 (×4): qty 2

## 2020-10-31 MED ORDER — FOLIC ACID 1 MG PO TABS
1.0000 mg | ORAL_TABLET | Freq: Every day | ORAL | Status: DC
  Administered 2020-10-31 – 2020-11-02 (×3): 1 mg via ORAL
  Filled 2020-10-31 (×3): qty 1

## 2020-10-31 MED ORDER — ACETAMINOPHEN 650 MG RE SUPP: 650.0000 mg | Freq: Four times a day (QID) | RECTAL | Status: DC | PRN

## 2020-10-31 MED ORDER — INSULIN ASPART 100 UNIT/ML IJ SOLN
0.0000 [IU] | Freq: Three times a day (TID) | INTRAMUSCULAR | Status: DC
  Administered 2020-10-31: 3 [IU] via SUBCUTANEOUS
  Administered 2020-11-01 – 2020-11-02 (×3): 2 [IU] via SUBCUTANEOUS

## 2020-10-31 MED ORDER — CARVEDILOL 6.25 MG PO TABS
6.2500 mg | ORAL_TABLET | Freq: Two times a day (BID) | ORAL | Status: DC
  Administered 2020-10-31 (×2): 6.25 mg via ORAL
  Filled 2020-10-31 (×3): qty 1

## 2020-10-31 MED ORDER — ATORVASTATIN CALCIUM 20 MG PO TABS
20.0000 mg | ORAL_TABLET | Freq: Every day | ORAL | Status: DC
  Administered 2020-10-31 – 2020-11-02 (×3): 20 mg via ORAL
  Filled 2020-10-31 (×3): qty 1

## 2020-10-31 MED ORDER — LORAZEPAM 1 MG PO TABS: 1.0000 mg | ORAL_TABLET | ORAL | Status: DC | PRN

## 2020-10-31 MED ORDER — ENOXAPARIN SODIUM 40 MG/0.4ML IJ SOSY
40.0000 mg | PREFILLED_SYRINGE | INTRAMUSCULAR | Status: DC
  Administered 2020-10-31 – 2020-11-01 (×2): 40 mg via SUBCUTANEOUS
  Filled 2020-10-31 (×2): qty 0.4

## 2020-10-31 MED ORDER — FUROSEMIDE 10 MG/ML IJ SOLN
20.0000 mg | Freq: Two times a day (BID) | INTRAMUSCULAR | Status: DC
  Administered 2020-10-31 – 2020-11-02 (×4): 20 mg via INTRAVENOUS
  Filled 2020-10-31 (×4): qty 2

## 2020-10-31 MED ORDER — ADULT MULTIVITAMIN W/MINERALS CH
1.0000 | ORAL_TABLET | Freq: Every day | ORAL | Status: DC
  Administered 2020-10-31 – 2020-11-02 (×3): 1 via ORAL
  Filled 2020-10-31 (×3): qty 1

## 2020-10-31 MED ORDER — THIAMINE HCL 100 MG/ML IJ SOLN
100.0000 mg | Freq: Every day | INTRAMUSCULAR | Status: DC
  Filled 2020-10-31: qty 2

## 2020-10-31 MED ORDER — GABAPENTIN 100 MG PO CAPS
100.0000 mg | ORAL_CAPSULE | Freq: Three times a day (TID) | ORAL | Status: DC
  Administered 2020-10-31 – 2020-11-02 (×6): 100 mg via ORAL
  Filled 2020-10-31 (×6): qty 1

## 2020-10-31 MED ORDER — LISINOPRIL-HYDROCHLOROTHIAZIDE 20-12.5 MG PO TABS: 1.0000 | ORAL_TABLET | Freq: Every day | ORAL | Status: DC

## 2020-10-31 MED ORDER — INSULIN ASPART PROT & ASPART (70-30 MIX) 100 UNIT/ML ~~LOC~~ SUSP
15.0000 [IU] | Freq: Two times a day (BID) | SUBCUTANEOUS | Status: DC
  Administered 2020-10-31 – 2020-11-02 (×4): 15 [IU] via SUBCUTANEOUS
  Filled 2020-10-31 (×2): qty 10

## 2020-10-31 MED ORDER — HYDROCHLOROTHIAZIDE 12.5 MG PO CAPS
12.5000 mg | ORAL_CAPSULE | Freq: Every day | ORAL | Status: DC
  Administered 2020-10-31: 12.5 mg via ORAL
  Filled 2020-10-31 (×2): qty 1

## 2020-10-31 NOTE — Progress Notes (Signed)
  Echocardiogram 2D Echocardiogram has been performed.  Eddie Foster 10/31/2020, 2:55 PM

## 2020-10-31 NOTE — ED Notes (Signed)
SBAR Report called to Herbert Seta, Charity fundraiser at [employer]

## 2020-10-31 NOTE — ED Notes (Signed)
Report to [facility] 

## 2020-10-31 NOTE — H&P (Signed)
History and Physical    Eddie Foster BJS:[id number] DOB: 1960-06-08 DOA: 10/30/2020  PCP: [facility]  Patient coming from: Home.  Chief Complaint: Shortness of breath.  HPI: Eddie Foster is a 60 y.o. male with history of CAD status post stenting, hypertension, chronic kidney disease stage III, anemia, diabetes mellitus, alcoholism and previous history of drug abuse has been experiencing shortness of breath particularly on lying flat in the bed.  This has been going on for last 2 to 3 days.  Denies chest pain had some nonproductive cough and flulike symptoms.  Given the symptoms patient presents to the ER.  Reviewing patient's cardiology notes in May 2022 patient has been experiencing some lower extremity edema which was attributed to patient's renal failure and low albumin levels.  ED Course: In the ER patient was mildly febrile with temperature 100 F no leukocytosis chest x-ray shows features concerning for CHF and interstitial edema.  Labs are significant for BNP of 1200 high sensitive troponin was flat at 47.  Hemoglobin 11.7 magnesium 1.6 creatinine 1.7 potassium 5.3.  Patient was given Lasix 40 mg IV and nitroglycerin patch given the symptoms were more consistent with CHF and admitted for further observation and management.  COVID test was negative.  Review of Systems: As per HPI, rest all negative.   Past Medical History:  Diagnosis Date   Arthritis    Coronary artery disease    Diabetes mellitus without complication (HCC)    Hypertension    Neuropathy     Past Surgical History:  Procedure Laterality Date   CARDIAC CATHETERIZATION     CORONARY ANGIOPLASTY     CORONARY STENT PLACEMENT       reports that he has been smoking cigarettes. He has never used smokeless tobacco. He reports current alcohol use. He reports previous drug use.  Allergies  Allergen Reactions   Shrimp Extract Allergy Skin Test Itching and Swelling    Family  History  Problem Relation Age of Onset   Diabetes Mellitus II Mother    Diabetes Mellitus II Father    CAD Sister     Prior to Admission medications   Medication Sig Start Date End Date Taking? Authorizing Provider  atorvastatin (LIPITOR) 20 MG tablet Take 20 mg by mouth daily.   Yes [provider]  carvedilol (COREG) 6.25 MG tablet Take 6.25 mg by mouth 2 (two) times daily. 06/21/18  Yes [provider]  clopidogrel (PLAVIX) 75 MG tablet Take 75 mg by mouth daily. 06/21/18  Yes [provider]  gabapentin (NEURONTIN) 100 MG capsule Take 100 mg by mouth 3 (three) times daily. 08/09/20  Yes [provider]  insulin aspart protamine- aspart (NOVOLOG MIX 70/30) (70-30) 100 UNIT/ML injection Inject 15 Units into the skin 2 (two) times daily.   Yes [provider]  lisinopril-hydrochlorothiazide (ZESTORETIC) 20-12.5 MG tablet Take 1 tablet by mouth daily. 10/10/19  Yes [provider]  sildenafil (VIAGRA) 100 MG tablet Take 100 mg by mouth daily as needed for erectile dysfunction. 09/23/20  Yes [provider]  Vitamin D, Ergocalciferol, (DRISDOL) 1.25 MG (50000 UNIT) CAPS capsule Take 50,000 Units by mouth every Monday. 10/30/20  Yes [provider]  furosemide (LASIX) 40 MG tablet Take by mouth. Patient not taking: Reported on 10/31/2020 05/26/19   [provider]  HYDROcodone-acetaminophen (NORCO/VICODIN) 5-325 MG tablet Take 1-2 tablets every 6 hours as needed for severe pain Patient not taking: No sig reported 05/04/15  Renne Crigler, PA-C  HYDROcodone-acetaminophen (NORCO/VICODIN) 5-325 MG tablet Take 1 tablet by mouth every 4 (four) hours as needed. Patient not taking: No sig reported 07/17/18   Jacalyn Lefevre, MD  meclizine (ANTIVERT) 25 MG tablet Take 1 tablet (25 mg total) by mouth 2 (two) times daily as needed for dizziness. Patient not taking: No sig reported 07/25/18   Pricilla Loveless, MD  methocarbamol (ROBAXIN)  500 MG tablet Take 2 tablets (1,000 mg total) by mouth 4 (four) times daily. Patient not taking: No sig reported 05/04/15   Renne Crigler, PA-C  naproxen (NAPROSYN) 500 MG tablet Take 1 tablet (500 mg total) by mouth 2 (two) times daily. Patient not taking: No sig reported 11/22/14   Hess, Melina Schools M, PA-C  ondansetron (ZOFRAN ODT) 4 MG disintegrating tablet Take 1 tablet (4 mg total) by mouth every 8 (eight) hours as needed. Patient not taking: No sig reported 07/17/18   Jacalyn Lefevre, MD    Physical Exam: Constitutional: Moderately built and nourished. Vitals:   10/31/20 0800 10/31/20 1100 10/31/20 1222 10/31/20 1300  BP: (!) 160/97 (!) 167/87 (!) 157/101   Pulse: 71 72 65   Resp: 15 14 16    Temp:   98.1 F (36.7 C)   TempSrc:      SpO2: 95% 96% 99%   Weight:    84.6 kg  Height:       Eyes: Anicteric no pallor. ENMT: No discharge from the ears eyes nose and mouth. Neck: JVD not appreciated.  No mass felt. Respiratory: No rhonchi or crepitations. Cardiovascular: S1-S2 heard. Abdomen: Soft nontender bowel sound present. Musculoskeletal: No edema. Skin: No rash. Neurologic: Alert awake oriented to time place and person.  Moves all extremities. Psychiatric: Appears normal.  Normal affect.   Labs on Admission: I have personally reviewed following labs and imaging studies  CBC: Recent Labs  Lab 10/30/20 2129  WBC 8.3  NEUTROABS 6.2  HGB 11.7*  HCT 34.9*  MCV 96.4  PLT 211   Basic Metabolic Panel: Recent Labs  Lab 10/30/20 2129 10/30/20 2255  NA 138  --   K 5.3*  --   CL 108  --   CO2 23  --   GLUCOSE 195*  --   BUN 20  --   CREATININE 1.76*  --   CALCIUM 8.8*  --   MG  --  1.6*   GFR: Estimated Creatinine Clearance: 44.6 mL/min (A) (by C-G formula based on SCr of 1.76 mg/dL (H)). Liver Function Tests: Recent Labs  Lab 10/30/20 2129  AST 14*  ALT 12  ALKPHOS 71  BILITOT 0.4  PROT 6.1*  ALBUMIN 2.9*   Recent Labs  Lab 10/30/20 2129  LIPASE 25   No  results for input(s): AMMONIA in the last 168 hours. Coagulation Profile: No results for input(s): INR, PROTIME in the last 168 hours. Cardiac Enzymes: No results for input(s): CKTOTAL, CKMB, CKMBINDEX, TROPONINI in the last 168 hours. BNP (last 3 results) No results for input(s): PROBNP in the last 8760 hours. HbA1C: No results for input(s): HGBA1C in the last 72 hours. CBG: Recent Labs  Lab 10/31/20 1233  GLUCAP 251*   Lipid Profile: No results for input(s): CHOL, HDL, LDLCALC, TRIG, CHOLHDL, LDLDIRECT in the last 72 hours. Thyroid Function Tests: Recent Labs    10/30/20 2255  TSH 0.865   Anemia Panel: No results for input(s): VITAMINB12, FOLATE, FERRITIN, TIBC, IRON, RETICCTPCT in the last 72 hours. Urine analysis:    Component Value Date/Time  COLORURINE YELLOW 07/16/2018 2223   APPEARANCEUR CLEAR 07/16/2018 2223   LABSPEC 1.012 07/16/2018 2223   PHURINE 5.0 07/16/2018 2223   GLUCOSEU 50 (A) 07/16/2018 2223   HGBUR MODERATE (A) 07/16/2018 2223   BILIRUBINUR NEGATIVE 07/16/2018 2223   KETONESUR NEGATIVE 07/16/2018 2223   PROTEINUR 100 (A) 07/16/2018 2223   UROBILINOGEN 1.0 12/09/2013 1937   NITRITE NEGATIVE 07/16/2018 2223   LEUKOCYTESUR TRACE (A) 07/16/2018 2223   Sepsis Labs: @LABRCNTIP (procalcitonin:4,lacticidven:4) ) Recent Results (from the past 240 hour(s))  Resp Panel by RT-PCR (Flu A&B, Covid) Nasopharyngeal Swab     Status: None   Collection Time: 10/30/20  9:29 PM   Specimen: Nasopharyngeal Swab; Nasopharyngeal(NP) swabs in vial transport medium  Result Value Ref Range Status   SARS Coronavirus 2 by RT PCR NEGATIVE NEGATIVE Final    Comment: (NOTE) SARS-CoV-2 target nucleic acids are NOT DETECTED.  The SARS-CoV-2 RNA is generally detectable in upper respiratory specimens during the acute phase of infection. The lowest concentration of SARS-CoV-2 viral copies this assay can detect is 138 copies/mL. A negative result does not preclude  SARS-Cov-2 infection and should not be used as the sole basis for treatment or other patient management decisions. A negative result may occur with  improper specimen collection/handling, submission of specimen other than nasopharyngeal swab, presence of viral mutation(s) within the areas targeted by this assay, and inadequate number of viral copies(<138 copies/mL). A negative result must be combined with clinical observations, patient history, and epidemiological information. The expected result is Negative.  Fact Sheet for Patients:  11/01/20  Fact Sheet for Healthcare Providers:  BloggerCourse.com  This test is no t yet approved or cleared by the [employer]ious[employer].[place] FDA and  has been authorized for detection and/or diagnosis of SARS-CoV-2 by FDA under an Emergency Use Authorization (EUA). This EUA will remain  in effect (meaning this test can be used) for the duration of the COVID-19 declaration under Section 564(b)(1) of the Act, 21 U.S.C.section 360bbb-3(b)(1), unless the authorization is terminated  or revoked sooner.       Influenza A by PCR NEGATIVE NEGATIVE Final   Influenza B by PCR NEGATIVE NEGATIVE Final    Comment: (NOTE) The Xpert Xpress SARS-CoV-2/FLU/RSV plus assay is intended as an aid in the diagnosis of influenza from Nasopharyngeal swab specimens and should not be used as a sole basis for treatment. Nasal washings and aspirates are unacceptable for Xpert Xpress SARS-CoV-2/FLU/RSV testing.  Fact Sheet for Patients: [place]  Fact Sheet for Healthcare Providers: BloggerCourse.com  This test is not yet approved or cleared by the SeriousBroker.it FDA and has been authorized for detection and/or diagnosis of SARS-CoV-2 by FDA under an Emergency Use Authorization (EUA). This EUA will remain in effect (meaning this test can be used) for the duration of  the COVID-19 declaration under Section 564(b)(1) of the Act, 21 U.S.C. section 360bbb-3(b)(1), unless the authorization is terminated or revoked.  Performed at [facility], [address] [place], [place]      Radiological Exams on Admission: DG Chest [facility] 1 View  Result Date: 10/30/2020 CLINICAL DATA:  Chest pain for 2 days EXAM: PORTABLE CHEST 1 VIEW COMPARISON:  07/16/2018 FINDINGS: Cardiac shadow is mildly enlarged but accentuated by the portable technique. Aortic calcifications are noted. The lungs are well aerated bilaterally. Mild vascular congestion is noted centrally without interstitial edema. No focal infiltrate is noted. No acute bony abnormality is seen. IMPRESSION: Mild vascular congestion without interstitial edema. Electronically Signed  By: Alcide Clever M.D.   On: 10/30/2020 21:35    EKG: Independently reviewed.  Normal sinus rhythm LVH.  Assessment/Plan Active Problems:   Acute CHF (HCC)   CAD (coronary artery disease)   Normocytic normochromic anemia   CKD (chronic kidney disease), stage III (HCC)   Acute CHF (congestive heart failure) (HCC)   DM (diabetes mellitus), type 2 with renal complications (HCC)    Acute CHF -unknown EF -Per patient's cardiology notes in May 2022 in Care Everywhere patient has had always normal LV.  We will check a 2D echo continue with diuresis I have ordered Lasix 20 mg IV every 12.  On nitroglycerin patch.  If patient's potassium is not showing any worsening then we will continue lisinopril.  In addition we will also check a D-dimer consult cardiology.  Closely follow intake of metabolic panel daily weights. CAD status post tenting denies any chest pain.  On statins Plavix Coreg.  Checking 2D echo. Diabetes mellitus type 2 with hyperglycemia we will continue home dose of NovoLoG 70/30 15 units twice daily with close follow-up of CBGs.  Will check hemoglobin A1c with next blood draw. Chronic kidney disease stage III  creatinine appears to be at baseline when compared to 1 in Care Everywhere. Chronic anemia likely from renal disease hemoglobin at baseline. Hypertension uncontrolled likely contributing to symptoms.  Patient is on lisinopril hydrochlorothiazide and Coreg and nitroglycerin patch.  (Lisinopril may be held if potassium shows worsening.). Alcohol abuse -drinks about 4 to 5 cans of beer every day.  We will keep patient on CIWA.  Given that patient has acute CHF with history of CAD will need close monitoring for any further worsening in inpatient status.   DVT prophylaxis: Lovenox. Code Status: Full code. Family Communication: Discussed with patient. Disposition Plan: Home. Consults called: Cardiology. Admission status: Inpatient.   Eduard Clos MD [facility] Pager [phone].  If 7PM-7AM, please contact night-coverage [email or web address] Password [email or web address]  10/31/2020, 1:16 PM

## 2020-11-01 ENCOUNTER — Encounter (HOSPITAL_COMMUNITY): Payer: Self-pay | Admitting: Internal Medicine

## 2020-11-01 DIAGNOSIS — I5021 Acute systolic (congestive) heart failure: Secondary | ICD-10-CM

## 2020-11-01 DIAGNOSIS — I5041 Acute combined systolic (congestive) and diastolic (congestive) heart failure: Secondary | ICD-10-CM

## 2020-11-01 LAB — GLUCOSE, CAPILLARY
Glucose-Capillary: 185 mg/dL — ABNORMAL HIGH (ref 70–99)
Glucose-Capillary: 183 mg/dL — ABNORMAL HIGH (ref 70–99)
Glucose-Capillary: 51 mg/dL — ABNORMAL LOW (ref 70–99)
Glucose-Capillary: 134 mg/dL — ABNORMAL HIGH (ref 70–99)
Glucose-Capillary: 145 mg/dL — ABNORMAL HIGH (ref 70–99)

## 2020-11-01 LAB — BASIC METABOLIC PANEL WITH GFR
Anion gap: 4 — ABNORMAL LOW (ref 5–15)
BUN: 28 mg/dL — ABNORMAL HIGH (ref 6–20)
CO2: 27 mmol/L (ref 22–32)
Calcium: 8.5 mg/dL — ABNORMAL LOW (ref 8.9–10.3)
Chloride: 106 mmol/L (ref 98–111)
Creatinine, Ser: 1.99 mg/dL — ABNORMAL HIGH (ref 0.61–1.24)
GFR, Estimated: 38 mL/min — ABNORMAL LOW
Glucose, Bld: 141 mg/dL — ABNORMAL HIGH (ref 70–99)
Potassium: 4.7 mmol/L (ref 3.5–5.1)
Sodium: 137 mmol/L (ref 135–145)

## 2020-11-01 LAB — CBC
HCT: 35.2 % — ABNORMAL LOW (ref 39.0–52.0)
Hemoglobin: 11.4 g/dL — ABNORMAL LOW (ref 13.0–17.0)
MCH: 32.2 pg (ref 26.0–34.0)
MCHC: 32.4 g/dL (ref 30.0–36.0)
MCV: 99.4 fL (ref 80.0–100.0)
Platelets: 197 10*3/uL (ref 150–400)
RBC: 3.54 MIL/uL — ABNORMAL LOW (ref 4.22–5.81)
RDW: 13.7 % (ref 11.5–15.5)
WBC: 6.4 10*3/uL (ref 4.0–10.5)
nRBC: 0 % (ref 0.0–0.2)

## 2020-11-01 LAB — MAGNESIUM: Magnesium: 2 mg/dL (ref 1.7–2.4)

## 2020-11-01 MED ORDER — SACUBITRIL-VALSARTAN 24-26 MG PO TABS: 1.0000 | ORAL_TABLET | Freq: Two times a day (BID) | ORAL | Status: DC

## 2020-11-01 MED ORDER — CARVEDILOL 6.25 MG PO TABS
6.2500 mg | ORAL_TABLET | Freq: Two times a day (BID) | ORAL | Status: DC
  Administered 2020-11-01 – 2020-11-02 (×2): 6.25 mg via ORAL
  Filled 2020-11-01 (×2): qty 1

## 2020-11-01 NOTE — TOC Initial Note (Signed)
Transition of Care [facility]) - Initial/Assessment Note    Patient Details  Name: Eddie Foster MRN: [id number] Date of Birth: [birth year]  Transition of Care [facility]) CM/SW Contact:    Lanier Clam, RN Phone Number: 11/01/2020, 1:35 PM  Clinical Narrative: Spoke to patient about d/c plans-home.declines any resources for etoh/substance abuse-he thinks he can manage on his own but if he changes his mind he will contact me. Has pcp,pharmacy,transport home. No further CM needs.                  Expected Discharge Plan: Home/Self Care Barriers to Discharge: Continued Medical Work up   Patient Goals and CMS Choice Patient states their goals for this hospitalization and ongoing recovery are:: go home CMS Medicare.gov Compare Post Acute Care list provided to:: Patient Choice offered to / list presented to : Patient  Expected Discharge Plan and Services Expected Discharge Plan: Home/Self Care   Discharge Planning Services: CM Consult   Living arrangements for the past 2 months: Single Family Home                                      Prior Living Arrangements/Services Living arrangements for the past 2 months: Single Family Home Lives with:: Self Patient language and need for interpreter reviewed:: Yes Do you feel safe going back to the place where you live?: Yes      Need for Family Participation in Patient Care: No (Comment) Care giver support system in place?: Yes (comment)   Criminal Activity/Legal Involvement Pertinent to Current Situation/Hospitalization: No - Comment as needed  Activities of Daily Living Home Assistive Devices/Equipment: CBG Meter, Eyeglasses ADL Screening (condition at time of admission) Patient's cognitive ability adequate to safely complete daily activities?: Yes Is the patient deaf or have difficulty hearing?: No Does the patient have difficulty seeing, even when wearing glasses/contacts?: No Does the patient have difficulty concentrating,  remembering, or making decisions?: No Patient able to express need for assistance with ADLs?: Yes Does the patient have difficulty dressing or bathing?: No Independently performs ADLs?: Yes (appropriate for developmental age) Does the patient have difficulty walking or climbing stairs?: Yes Weakness of Legs: None Weakness of Arms/Hands: None  Permission Sought/Granted Permission sought to share information with : Case Manager Permission granted to share information with : Yes, Verbal Permission Granted  Share Information with NAME: Case manager           Emotional Assessment Appearance:: Appears stated age Attitude/Demeanor/Rapport: Gracious Affect (typically observed): Accepting Orientation: : Oriented to Self, Oriented to Place, Oriented to  Time, Oriented to Situation Alcohol / Substance Use: Illicit Drugs, Alcohol Use Psych Involvement: No (comment)  Admission diagnosis:  SOB (shortness of breath) [R06.02] Acute CHF (HCC) [I50.9] Acute CHF (congestive heart failure) (HCC) [I50.9] Patient Active Problem List   Diagnosis Date Noted   CAD (coronary artery disease) 10/31/2020   Normocytic normochromic anemia 10/31/2020   CKD (chronic kidney disease), stage III (HCC) 10/31/2020   Acute CHF (congestive heart failure) (HCC) 10/31/2020   DM (diabetes mellitus), type 2 with renal complications (HCC) 10/31/2020   Acute CHF (HCC) 10/30/2020   PCP:  Inc, [facility] Pharm[facility]:   [id number] - [place], [place] - [address] [place] [address] Phone: [phone] Fax: [phone]  [facility] [id number] - [place], [place] - [address]  [address] [place] [address] Phone: [phone] Fax: [phone]  [facility] - [place], [place] - [address] [place] [address] Phone: [phone] Fax: [phone]     Social  Determinants of Health (SDOH) Interventions    Readmission Risk Interventions No flowsheet data found.

## 2020-11-01 NOTE — Plan of Care (Signed)
?  Problem: Education: ?Goal: Knowledge of General Education information will improve ?Description: Including pain rating scale, medication(s)/side effects and non-pharmacologic comfort measures ?Outcome: Progressing ?  ?Problem: Activity: ?Goal: Capacity to carry out activities will improve ?Outcome: Progressing ?  ?

## 2020-11-01 NOTE — Progress Notes (Addendum)
PROGRESS NOTE    Eddie Foster  KXF:[id number] DOB: 12-Sep-1960 DOA: 10/30/2020 PCP: [facility]   Chief Complaint  Patient presents with   Cough   Shortness of Breath   Brief Narrative: 60 year old male with CAD history of stent contesting, CKD stage III, anemia diabetes alcoholism history of drug abuse admitted with shortness of breath and Blood x2 to 3 days, no chest pain but some nonproductive cough and flulike symptoms. In the ED low-grade temp 100 chest x-ray concerning for CHF, interstitial edema labs with creatinine 1.7 potassium 5.3 Lasix was given along with nitroglycerin patch and admitted.  COVID-19 negative  Subjective: Seen and examined this morning.  Feels well today no complaint.  No shortness of breath.  Assessment & Plan:  Acute combined systolic and diastolic CHF Echo this admission showed EF 25%, global hypokinesis LV, grade 2 diastolic dysfunction.  Previously, 35-40% in July 2019 and felt to be nonischemic- EF 26% in 07/2019 and ef remains low. Cardiology has been consulted, on IV Lasix. Will need to optimize medical regimen. Monitor I/o, daily wt, keep on salt and fluid restricted diet. Intake/Output Summary (Last 24 hours) at 11/01/2020 0736 Last data filed at 11/01/2020 0536 Gross per 24 hour  Intake --  Output 900 ml  Net -900 ml   Wt Readings from Last 3 Encounters:  11/01/20 83.7 kg  03/11/20 90.7 kg  07/25/18 94.8 kg   Positive D-dimer duplex negative pulmonary perfusion, no evidence of PE  Hypertension uncontrolled.  On lisinopril HCTZ Coreg and nitroglycerin patch.  Monitor and adjust  CAD with history of stent continue his Plavix Coreg statin.  Elevated troponin flat no delta likely from systolic CHF.  CKD stage IIIb: Creatinine 1.6. previous creat at 2.0 in April 2022. Monitor. Recent Labs  Lab 10/30/20 2129 10/31/20 1340 11/01/20 0429  BUN 20 22* 28*  CREATININE 1.76* 1.66*  1.74* 1.99*    Chronic anemia  suspect anemia of chronic renal disease hemoglobin is stable.  Monitor Hypomagnesemia repleted DMT2: Hemoglobin A1c 6.8.  Blood sugar borderline controlLED. Recent Labs  Lab 10/31/20 1233 10/31/20 1705 10/31/20 2045 11/01/20 0715  GLUCAP 251* 207* 142* 185*   Alcohol abuse drinks 4-5 blunts per every day continue CIWA monitor for withdrawal   Cocaine positive in urine drug screen.  admits using cocaine a week ago- but reports he has "quit".  Mil dilatation of Asc Colon, needs OP fu   Diet Order             Diet heart healthy/carb modified Room service appropriate? Yes; Fluid consistency: Thin; Fluid restriction: 1200 mL Fluid  Diet effective now                  Patient's Body mass index is 27.26 kg/m.  DVT prophylaxis: enoxaparin (LOVENOX) injection 40 mg Start: 10/31/20 1800 Code Status:   Code Status: Full Code  Family Communication: plan of care discussed with patient at bedside. Status is: Inpatient Remains inpatient appropriate because:Inpatient level of care appropriate due to severity of illness and for management of acute systolic diastolic CHF exacerbation Dispo: The patient is from: Home              Anticipated d/c is to: Home              Patient currently is not medically stable to d/c.   Difficult to place patient No Unresulted Labs (From admission, onward)     Start  Ordered   11/07/20 0500  Creatinine, serum  (enoxaparin (LOVENOX)    CrCl >/= 30 ml/min)  Weekly,   R     Comments: while on enoxaparin therapy    10/31/20 1315          Medications reviewed:  Schedulcourseed Meds:  atorvastatin  20 mg Oral Daily   carvedilol  6.25 mg Oral BID   clopidogrel  75 mg Oral Daily   enoxaparin (LOVENOX) injection  40 mg Subcutaneous Q24H   folic acid  1 mg Oral Daily   furosemide  20 mg Intravenous Q12H   gabapentin  100 mg Oral TID   lisinopril  20 mg Oral Daily   And   hydrochlorothiazide  12.5 mg Oral Daily   insulin aspart  0-9 Units  Subcutaneous TID WC   insulin aspart protamine- aspart  15 Units Subcutaneous BID WC   multivitamin with minerals  1 tablet Oral Daily   thiamine  100 mg Oral Daily   Or   thiamine  100 mg Intravenous Daily   Continuous Infusions: Consultants: Cardiology   procedures:see note Antimicrobials: Anti-infectives (From admission, onward)    None      Culture/Microbiology No results found for: SDES, SPECREQUEST, CULT, REPTSTATUS  Other culture-see note  Objective: Vitals: Today's Vitals   10/31/20 2204 11/01/20 0016 11/01/20 0500 11/01/20 0546  BP:  138/84  (!) 144/87  Pulse:  62  62  Resp:    18  Temp:  98.2 F (36.8 C)  98.2 F (36.8 C)  TempSrc:  Oral  Oral  SpO2:  100%  99%  Weight:   83.7 kg   Height:      PainSc: Asleep       Intake/Output Summary (Last 24 hours) at 11/01/2020 0732 Last data filed at 11/01/2020 0536 Gross per 24 hour  Intake --  Output 900 ml  Net -900 ml   Filed Weights   10/30/20 2015 10/31/20 1300 11/01/20 0500  Weight: 88.9 kg 84.6 kg 83.7 kg   Weight change: -4.355 kg  Intake/Output from previous day: 07/14 0701 - 07/15 0700 In: -  Out: 900 [Urine:900] Intake/Output this shift: No intake/output data recorded. Filed Weights   10/30/20 2015 10/31/20 1300 11/01/20 0500  Weight: 88.9 kg 84.6 kg 83.7 kg   Examination: General exam: AAOx3,older than stated age, weak appearing. HEENT:Oral mucosa moist, Ear/Nose WNL grossly,dentition normal. Respiratory system: bilaterally clear,no use of accessory muscle, non tender. Cardiovascular system: S1 & S2 +,M0No JVD. Gastrointestinal system: Abdomen soft, NT,ND, BS+. Nervous System:Alert, awake, moving extremities Extremities: no edema, distal peripheral pulses palpable.  Skin: No rashes,no icterus. MSK: Normal muscle bulk,tone, power Data Reviewed: I have personally reviewed following labs and imaging studies CBC: Recent Labs  Lab 10/30/20 2129 10/31/20 1340 11/01/20 0429  WBC 8.3  7.6 6.4  NEUTROABS 6.2  --   --   HGB 11.7* 12.3* 11.4*  HCT 34.9* 38.5* 35.2*  MCV 96.4 100.3* 99.4  PLT 211 200 197   Basic Metabolic Panel: Recent Labs  Lab 10/30/20 2129 10/30/20 2255 10/31/20 1340 11/01/20 0429  NA 138  --  137 137  K 5.3*  --  4.9 4.7  CL 108  --  104 106  CO2 23  --  26 27  GLUCOSE 195*  --  260* 141*  BUN 20  --  22* 28*  CREATININE 1.76*  --  1.66*  1.74* 1.99*  CALCIUM 8.8*  --  8.9 8.5*  MG  --  1.6*  --  2.0   GFR: Estimated Creatinine Clearance: 39.5 mL/min (A) (by C-G formula based on SCr of 1.99 mg/dL (H)). Liver Function Tests: Recent Labs  Lab 10/30/20 2129  AST 14*  ALT 12  ALKPHOS 71  BILITOT 0.4  PROT 6.1*  ALBUMIN 2.9*   Recent Labs  Lab 10/30/20 2129  LIPASE 25   No results for input(s): AMMONIA in the last 168 hours. Coagulation Profile: No results for input(s): INR, PROTIME in the last 168 hours. Cardiac Enzymes: No results for input(s): CKTOTAL, CKMB, CKMBINDEX, TROPONINI in the last 168 hours. BNP (last 3 results) No results for input(s): PROBNP in the last 8760 hours. HbA1C: Recent Labs    10/31/20 1340  HGBA1C 6.8*   CBG: Recent Labs  Lab 10/31/20 1233 10/31/20 1705 10/31/20 2045 11/01/20 0715  GLUCAP 251* 207* 142* 185*   Lipid Profile: No results for input(s): CHOL, HDL, LDLCALC, TRIG, CHOLHDL, LDLDIRECT in the last 72 hours. Thyroid Function Tests: Recent Labs    10/31/20 1340  TSH 1.378   Anemia Panel: No results for input(s): VITAMINB12, FOLATE, FERRITIN, TIBC, IRON, RETICCTPCT in the last 72 hours. Sepsis Labs: No results for input(s): PROCALCITON, LATICACIDVEN in the last 168 hours.  Recent Results (from the past 240 hour(s))  Resp Panel by RT-PCR (Flu A&B, Covid) Nasopharyngeal Swab     Status: None   Collection Time: 10/30/20  9:29 PM   Specimen: Nasopharyngeal Swab; Nasopharyngeal(NP) swabs in vial transport medium  Result Value Ref Range Status   SARS Coronavirus 2 by RT PCR  NEGATIVE NEGATIVE Final    Comment: (NOTE) SARS-CoV-2 target nucleic acids are NOT DETECTED.  The SARS-CoV-2 RNA is generally detectable in upper respiratory specimens during the acute phase of infection. The lowest concentration of SARS-CoV-2 viral copies this assay can detect is 138 copies/mL. A negative result does not preclude SARS-Cov-2 infection and should not be used as the sole basis for treatment or other patient management decisions. A negative result may occur with  improper specimen collection/handling, submission of specimen other than nasopharyngeal swab, presence of viral mutation(s) within the areas targeted by this assay, and inadequate number of viral copies(<138 copies/mL). A negative result must be combined with clinical observations, patient history, and epidemiological information. The expected result is Negative.  Fact Sheet for Patients:  BloggerCourse.com  Fact Sheet for Healthcare Providers:  SeriousBroker.it  This test is no t yet approved or cleared by the [place] FDA and  has been authorized for detection and/or diagnosis of SARS-CoV-2 by FDA under an Emergency Use Authorization (EUA). This EUA will remain  in effect (meaning this test can be used) for the duration of the COVID-19 declaration under Section 564(b)(1) of the Act, 21 U.S.C.section 360bbb-3(b)(1), unless the authorization is terminated  or revoked sooner.       Influenza A by PCR NEGATIVE NEGATIVE Final   Influenza B by PCR NEGATIVE NEGATIVE Final    Comment: (NOTE) The Xpert Xpress SARS-CoV-2/FLU/RSV plus assay is intended as an aid in the diagnosis of influenza from Nasopharyngeal swab specimens and should not be used as a sole basis for treatment. Nasal washings and aspirates are unacceptable for Xpert Xpress SARS-CoV-2/FLU/RSV testing.  Fact Sheet for Patients: BloggerCourse.com  Fact Sheet for  Healthcare Providers: SeriousBroker.it  This test is not yet approved or cleared by the [place] FDA and has been authorized for detection and/or diagnosis of SARS-CoV-2 by FDA under an Emergency Use Authorization (EUA). This EUA will remain  in effect (meaning this test can be used) for the duration of the COVID-19 declaration under Section 564(b)(1) of the Act, 21 U.S.C. section 360bbb-3(b)(1), unless the authorization is terminated or revoked.  Performed at [facility], [address] [place], [place] [address]      Radiology Studies: NM Pulmonary Perfusion  Result Date: 10/31/2020 CLINICAL DATA:  Chest pain and shortness of breath EXAM: NUCLEAR MEDICINE PERFUSION LUNG SCAN TECHNIQUE: Perfusion images were obtained in multiple projections after intravenous injection of radiopharmaceutical. Ventilation scans intentionally deferred if perfusion scan and chest x-ray adequate for interpretation during COVID 19 epidemic. RADIOPHARMACEUTICALS:  4.3 mCi Tc-69m MAA IV COMPARISON:  Chest x-ray from the previous day. FINDINGS: There is adequate uptake of radioactive tracer throughout both lungs on perfusion images. No wedge-shaped defects are identified to suggest pulmonary embolism. IMPRESSION: No evidence of pulmonary embolism. Electronically Signed   By: Alcide Clever M.D.   On: 10/31/2020 19:52   DG Chest Port 1 View  Result Date: 10/30/2020 CLINICAL DATA:  Chest pain for 2 days EXAM: PORTABLE CHEST 1 VIEW COMPARISON:  07/16/2018 FINDINGS: Cardiac shadow is mildly enlarged but accentuated by the portable technique. Aortic calcifications are noted. The lungs are well aerated bilaterally. Mild vascular congestion is noted centrally without interstitial edema. No focal infiltrate is noted. No acute bony abnormality is seen. IMPRESSION: Mild vascular congestion without interstitial edema. Electronically Signed   By: Alcide Clever M.D.   On: 10/30/2020 21:35    ECHOCARDIOGRAM COMPLETE  Result Date: 10/31/2020    ECHOCARDIOGRAM REPORT   Patient Name:   ALEXX GAYDEN Strege Date of Exam: 10/31/2020 Medical Rec #:  [id number]         Height:       69.0 in Accession #:    [id number]        Weight:       186.4 lb Date of Birth:  May 17, 1960         BSA:          2.005 m Patient Age:    59 years          BP:           157/101 mmHg Patient Gender: M                 HR:           63 bpm. Exam Location:  Inpatient Procedure: 2D Echo, 3D Echo, Cardiac Doppler and Color Doppler Indications:    I50.40* Unspecified combined systolic (congestive) and diastolic                 (congestive) heart failure  History:        Patient has no prior history of Echocardiogram examinations.                 CHF, CAD; Risk Factors:Diabetes.  Sonographer:    Sheralyn Boatman RDCS Referring Phys: [phone] Meryle Ready [facility] IMPRESSIONS  1. Left ventricular ejection fraction, by estimation, is 25%. The left ventricle has severely decreased function. The left ventricle demonstrates global hypokinesis. The left ventricular internal cavity size was mildly dilated. There is mild left ventricular hypertrophy. Left ventricular diastolic parameters are consistent with Grade II diastolic dysfunction (pseudonormalization).  2. Right ventricular systolic function is mildly reduced. The right ventricular size is normal. There is moderately elevated pulmonary artery systolic pressure. The estimated right ventricular systolic pressure is 50.0 mmHg.  3. Left atrial size was moderately dilated.  4. Right  atrial size was mildly dilated.  5. The mitral valve is normal in structure. Trivial mitral valve regurgitation. No evidence of mitral stenosis.  6. The aortic valve is tricuspid. Aortic valve regurgitation is not visualized. Mild aortic valve sclerosis is present, with no evidence of aortic valve stenosis.  7. Aortic dilatation noted. There is mild dilatation of the ascending aorta, measuring 40 mm.  8. The inferior vena cava  is dilated in size with >50% respiratory variability, suggesting right atrial pressure of 8 mmHg. FINDINGS  Left Ventricle: Left ventricular ejection fraction, by estimation, is 25%. The left ventricle has severely decreased function. The left ventricle demonstrates global hypokinesis. The left ventricular internal cavity size was mildly dilated. There is mild left ventricular hypertrophy. Left ventricular diastolic parameters are consistent with Grade II diastolic dysfunction (pseudonormalization). Right Ventricle: The right ventricular size is normal. No increase in right ventricular wall thickness. Right ventricular systolic function is mildly reduced. There is moderately elevated pulmonary artery systolic pressure. The tricuspid regurgitant velocity is 3.24 m/s, and with an assumed right atrial pressure of 8 mmHg, the estimated right ventricular systolic pressure is 50.0 mmHg. Left Atrium: Left atrial size was moderately dilated. Right Atrium: Right atrial size was mildly dilated. Pericardium: Trivial pericardial effusion is present. Mitral Valve: The mitral valve is normal in structure. Mild mitral annular calcification. Trivial mitral valve regurgitation. No evidence of mitral valve stenosis. Tricuspid Valve: The tricuspid valve is normal in structure. Tricuspid valve regurgitation is trivial. Aortic Valve: The aortic valve is tricuspid. Aortic valve regurgitation is not visualized. Mild aortic valve sclerosis is present, with no evidence of aortic valve stenosis. Pulmonic Valve: The pulmonic valve was normal in structure. Pulmonic valve regurgitation is not visualized. Aorta: The aortic root is normal in size and structure and aortic dilatation noted. There is mild dilatation of the ascending aorta, measuring 40 mm. Venous: The inferior vena cava is dilated in size with greater than 50% respiratory variability, suggesting right atrial pressure of 8 mmHg. IAS/Shunts: No atrial level shunt detected by color  flow Doppler.  LEFT VENTRICLE PLAX 2D LVIDd:         5.70 cm      Diastology LVIDs:         5.10 cm      LV e' medial:    4.79 cm/s LV PW:         1.60 cm      LV E/e' medial:  20.9 LV IVS:        1.50 cm      LV e' lateral:   5.05 cm/s LVOT diam:     2.30 cm      LV E/e' lateral: 19.8 LV SV:         86 LV SV Index:   43 LVOT Area:     4.15 cm  LV Volumes (MOD) LV vol d, MOD A2C: 161.0 ml LV vol d, MOD A4C: 180.0 ml LV vol s, MOD A2C: 126.0 ml LV vol s, MOD A4C: 134.0 ml LV SV MOD A2C:     35.0 ml LV SV MOD A4C:     180.0 ml LV SV MOD BP:      44.2 ml RIGHT VENTRICLE             IVC RV S prime:     12.60 cm/s  IVC diam: 2.40 cm TAPSE (M-mode): 1.7 cm LEFT ATRIUM           Index  RIGHT ATRIUM           Index LA diam:      4.60 cm 2.29 cm/m  RA Area:     18.50 cm LA Vol (A2C): 83.9 ml 41.84 ml/m RA Volume:   56.60 ml  28.23 ml/m LA Vol (A4C): 52.1 ml 25.98 ml/m  AORTIC VALVE LVOT Vmax:   101.00 cm/s LVOT Vmean:  66.100 cm/s LVOT VTI:    0.206 m  AORTA Ao Root diam: 3.40 cm Ao Asc diam:  4.00 cm MITRAL VALVE                TRICUSPID VALVE MV Area (PHT): 3.61 cm     TR Peak grad:   42.0 mmHg MV Decel Time: 210 msec     TR Vmax:        324.00 cm/s MV E velocity: 100.00 cm/s MV A velocity: 34.30 cm/s   SHUNTS MV E/A ratio:  2.92         Systemic VTI:  0.21 m                             Systemic Diam: 2.30 cm Marca Ancona MD Electronically signed by Marca Ancona MD Signature Date/Time: 10/31/2020/5:20:10 PM    Final    VAS [place] LOWER EXTREMITY VENOUS (DVT)  Result Date: 10/31/2020  Lower Venous DVT Study Patient Name:  ESTIVEN KOHAN Sneeringer  Date of Exam:   10/31/2020 Medical Rec #: [id number]          Accession #:    [id number] Date of Birth: 08-21-1960          Patient Gender: M Patient Age:   59Y Exam Location:  [facility] Procedure:      VAS [place] LOWER EXTREMITY VENOUS (DVT) Referring Phys: [id number] Eduard Clos --------------------------------------------------------------------------------   Indications: SOB. Other Indications: Acute CHF & CKD3 with BLE edema. Comparison Study: No previous exams Performing Technologist: Jody Hill RVT, RDMS  Examination Guidelines: A complete evaluation includes B-mode imaging, spectral Doppler, color Doppler, and power Doppler as needed of all accessible portions of each vessel. Bilateral testing is considered an integral part of a complete examination. Limited examinations for reoccurring indications may be performed as noted. The reflux portion of the exam is performed with the patient in reverse Trendelenburg.  +---------+---------------+---------+-----------+----------+--------------+ RIGHT    CompressibilityPhasicitySpontaneityPropertiesThrombus Aging +---------+---------------+---------+-----------+----------+--------------+ CFV      Full           Yes      Yes                                 +---------+---------------+---------+-----------+----------+--------------+ SFJ      Full                                                        +---------+---------------+---------+-----------+----------+--------------+ FV Prox  Full           Yes      Yes                                 +---------+---------------+---------+-----------+----------+--------------+ FV Mid   Full  Yes      Yes                                 +---------+---------------+---------+-----------+----------+--------------+ FV DistalFull           Yes      Yes                                 +---------+---------------+---------+-----------+----------+--------------+ PFV      Full                                                        +---------+---------------+---------+-----------+----------+--------------+ POP      Full           Yes      Yes                                 +---------+---------------+---------+-----------+----------+--------------+ PTV      Full                                                         +---------+---------------+---------+-----------+----------+--------------+ PERO     Full                                                        +---------+---------------+---------+-----------+----------+--------------+   +---------+---------------+---------+-----------+----------+--------------+ LEFT     CompressibilityPhasicitySpontaneityPropertiesThrombus Aging +---------+---------------+---------+-----------+----------+--------------+ CFV      Full           Yes      Yes                                 +---------+---------------+---------+-----------+----------+--------------+ SFJ      Full                                                        +---------+---------------+---------+-----------+----------+--------------+ FV Prox  Full           Yes      Yes                                 +---------+---------------+---------+-----------+----------+--------------+ FV Mid   Full           Yes      Yes                                 +---------+---------------+---------+-----------+----------+--------------+ FV DistalFull           Yes      Yes                                 +---------+---------------+---------+-----------+----------+--------------+  PFV      Full                                                        +---------+---------------+---------+-----------+----------+--------------+ POP      Full           Yes      Yes                                 +---------+---------------+---------+-----------+----------+--------------+ PTV      Full                                                        +---------+---------------+---------+-----------+----------+--------------+ PERO     Full                                                        +---------+---------------+---------+-----------+----------+--------------+     Summary: BILATERAL: - No evidence of deep vein thrombosis seen in the lower extremities, bilaterally. - No evidence of  superficial venous thrombosis in the lower extremities, bilaterally. -No evidence of popliteal cyst, bilaterally.   *See table(s) above for measurements and observations. Electronically signed by Heath Larkhomas Hawken on 10/31/2020 at 5:15:12 PM.    Final      LOS: 1 day   Lanae Boastamesh Domanic Matusek, MD [facility]  11/01/2020, 7:32 AM

## 2020-11-01 NOTE — Progress Notes (Signed)
Hypoglycemic Event  CBG: 51  Treatment: 8 oz juice/soda  Symptoms: None  Follow-up CBG: Time:1652 CBG Result:134  Possible Reasons for Event: Unknown  Comments/MD notified:hypoglycemic protocol    Curley Spice

## 2020-11-01 NOTE — Consult Note (Addendum)
Cardiology Consultation:   Patient ID: Eddie Foster MRN: [id number]; DOB: 28-Apr-1960  Admit date: 10/30/2020 Date of Consult: 11/01/2020  PCP:  [facility]   [facility] Providers Cardiologist:  Dr. Benita Stabile here to update MD or APP on Care Team, Refresh:1}     Patient Profile:   Eddie Foster is a 60 y.o. 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 Toniann Fail. Of note, UDS + cocaine this admission.  History of Present Illness:   Eddie Foster follows with outside cardiology practice. Notes were reviewed. In 10/2017 he had an abnormal stress echo with EF 35-40% at that time. He underwent cath showing 80% LAD treated with stenting, otherwise 10% LCx, 10% RCA, cath EF reported 55%. 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 Dr. Perry Mount last note 08/2020 references the patient having had well-preserved LV function in the past - do not see echo from 07/2019 to confirm low stress EF at that time. He saw the patient in May 2022 with increasing edema and recommended updating an echocardiogram but does not appear patient had this done yet. He has been maintained on Plavix monotherapy per last OV.  He presented to the hospital with flu-like symptoms with low grade temperature 100.2, myalgias, chest tightness without overt pain, and orthopnea for 2-3 days. Covid/flu negative. CXR with mild vascular congestion without interstitial edema. Labs showed + UDS for cocaine, hyperkalemia of 5.3, Cr 1.76-1.99, hypoalbuminemia of 2.9, BNP 1201, mild anemia Hgb 11.7,  hsTroponin low/flat at 47-47, hypomagnsemia of 1.6, d-dimer 1.09, A1C 6.8, TSH wnl. EKG shows NSR with LVH and TW changes generally unchanged from 2020. VQ scan negative for PE. 2D echo 10/31/20 EF 25% with global HK, mild LV dilation, mild LVH, grade 2 DD, moderately elevated PASP, moderately dilated LA, mildly dilated RA, mild dilation of ascending aorta, dilated IVC.   He was briefly treated with NTG paste and has been diuresed with very low dose Lasix 20mg  IV BID and continued on home carvedilol 6.25mg  BID and lisinopril/HCTZ 20/12.5mg  daily. Weights so far are 196 on admit (stated wt) down to 184.6 today, -2.5L net so far with -900 UOP yesterday. Was not on any standing Lasix at home.   Past Medical History:  Diagnosis Date   Arthritis    Coronary artery disease    Diabetes mellitus without complication (HCC)    Hypertension    Neuropathy     Past Surgical History:  Procedure Laterality Date   CARDIAC CATHETERIZATION     CORONARY ANGIOPLASTY     CORONARY STENT PLACEMENT       Home Medications:  Prior to Admission medications   Medication Sig Start Date End Date Taking? Authorizing Provider  atorvastatin (LIPITOR) 20 MG tablet Take 20 mg by mouth daily.   Yes [provider]  carvedilol (COREG) 6.25 MG tablet Take 6.25 mg by mouth 2 (two) times daily. 06/21/18  Yes [provider]  clopidogrel (PLAVIX) 75 MG tablet Take 75 mg by mouth daily. 06/21/18  Yes [provider]  gabapentin (NEURONTIN) 100 MG capsule Take 100  mg by mouth 3 (three) times daily. 08/09/20  Yes [provider]  insulin aspart protamine- aspart (NOVOLOG MIX 70/30) (70-30) 100 UNIT/ML injection Inject 15 Units into the skin 2 (two) times daily.   Yes [provider]  lisinopril-hydrochlorothiazide (ZESTORETIC) 20-12.5 MG tablet Take 1 tablet by mouth daily. 10/10/19  Yes [provider]  sildenafil (VIAGRA) 100 MG tablet Take 100 mg by mouth daily as needed for  erectile dysfunction. 09/23/20  Yes [provider]  Vitamin D, Ergocalciferol, (DRISDOL) 1.25 MG (50000 UNIT) CAPS capsule Take 50,000 Units by mouth every Monday. 10/30/20  Yes [provider]  furosemide (LASIX) 40 MG tablet Take by mouth. Patient not taking: Reported on 10/31/2020 05/26/19   [provider]  HYDROcodone-acetaminophen (NORCO/VICODIN) 5-325 MG tablet Take 1-2 tablets every 6 hours as needed for severe pain Patient not taking: No sig reported 05/04/15   Renne Crigler, PA-C  HYDROcodone-acetaminophen (NORCO/VICODIN) 5-325 MG tablet Take 1 tablet by mouth every 4 (four) hours as needed. Patient not taking: No sig reported 07/17/18   Jacalyn Lefevre, MD  meclizine (ANTIVERT) 25 MG tablet Take 1 tablet (25 mg total) by mouth 2 (two) times daily as needed for dizziness. Patient not taking: No sig reported 07/25/18   Pricilla Loveless, MD  methocarbamol (ROBAXIN) 500 MG tablet Take 2 tablets (1,000 mg total) by mouth 4 (four) times daily. Patient not taking: No sig reported 05/04/15   Renne Crigler, PA-C  naproxen (NAPROSYN) 500 MG tablet Take 1 tablet (500 mg total) by mouth 2 (two) times daily. Patient not taking: No sig reported 11/22/14   Hess, Melina Schools M, PA-C  ondansetron (ZOFRAN ODT) 4 MG disintegrating tablet Take 1 tablet (4 mg total) by mouth every 8 (eight) hours as needed. Patient not taking: No sig reported 07/17/18   Jacalyn Lefevre, MD    Inpatient Medications: Scheduled Meds:  atorvastatin  20 mg Oral Daily   carvedilol  6.25 mg Oral BID   clopidogrel  75 mg Oral Daily   enoxaparin (LOVENOX) injection  40 mg Subcutaneous Q24H   folic acid  1 mg Oral Daily   furosemide  20 mg Intravenous Q12H   gabapentin  100 mg Oral TID   lisinopril  20 mg Oral Daily   And   hydrochlorothiazide  12.5 mg Oral Daily   insulin aspart  0-9 Units Subcutaneous TID WC   insulin aspart protamine- aspart  15 Units Subcutaneous BID WC   multivitamin with minerals  1 tablet  Oral Daily   thiamine  100 mg Oral Daily   Or   thiamine  100 mg Intravenous Daily   Continuous Infusions:  PRN Meds: acetaminophen **OR** acetaminophen, LORazepam **OR** LORazepam  Allergies:    Allergies  Allergen Reactions   Shrimp Extract Allergy Skin Test Itching and Swelling    Social History:   Social History   Socioeconomic History   Marital status: Single    Spouse name: Not on file   Number of children: Not on file   Years of education: Not on file   Highest education level: Not on file  Occupational History   Not on file  Tobacco Use   Smoking status: Some Days    Types: Cigarettes   Smokeless tobacco: Never  Substance and Sexual Activity   Alcohol use: Yes    Comment: drinks daily   Drug use: Not Currently   Sexual activity: Not on file  Other Topics Concern   Not on file  Social  History Narrative   ** Merged History Encounter **       Social Determinants of Health   Financial Resource Strain: Not on file  Food Insecurity: Not on file  Transportation Needs: Not on file  Physical Activity: Not on file  Stress: Not on file  Social Connections: Not on file  Intimate Partner Violence: Not on file    Family History:    Family History  Problem Relation Age of Onset   Diabetes Mellitus II Mother    Diabetes Mellitus II Father    CAD Sister      ROS:  Please see the history of present illness.  All other ROS reviewed and negative.     Physical Exam/Data:   Vitals:   10/31/20 2044 11/01/20 0016 11/01/20 0500 11/01/20 0546  BP: (!) 167/95 138/84  (!) 144/87  Pulse: 70 62  62  Resp: 16   18  Temp: 98.6 F (37 C) 98.2 F (36.8 C)  98.2 F (36.8 C)  TempSrc: Oral Oral  Oral  SpO2: 100% 100%  99%  Weight:   83.7 kg   Height:        Intake/Output Summary (Last 24 hours) at 11/01/2020 0858 Last data filed at 11/01/2020 0536 Gross per 24 hour  Intake --  Output 900 ml  Net -900 ml   Last 3 Weights 11/01/2020 10/31/2020 10/30/2020  Weight  (lbs) 184 lb 9.6 oz 186 lb 6.4 oz 196 lb  Weight (kg) 83.734 kg 84.55 kg 88.905 kg     Body mass index is 27.26 kg/m.  Exam per MD review  EKG:  The EKG was personally reviewed and demonstrates:  NSR 77bpm, LVH with nonspecific TW changes inferiorly and V6 without significant change from prior  Telemetry:  Telemetry was personally reviewed and demonstrates:  Per MD review  Relevant CV Studies: 2D echo 10/31/20   1. Left ventricular ejection fraction, by estimation, is 25%. The left  ventricle has severely decreased function. The left ventricle demonstrates  global hypokinesis. The left ventricular internal cavity size was mildly  dilated. There is mild left  ventricular hypertrophy. Left ventricular diastolic parameters are  consistent with Grade II diastolic dysfunction (pseudonormalization).   2. Right ventricular systolic function is mildly reduced. The right  ventricular size is normal. There is moderately elevated pulmonary artery  systolic pressure. The estimated right ventricular systolic pressure is  50.0 mmHg.   3. Left atrial size was moderately dilated.   4. Right atrial size was mildly dilated.   5. The mitral valve is normal in structure. Trivial mitral valve  regurgitation. No evidence of mitral stenosis.   6. The aortic valve is tricuspid. Aortic valve regurgitation is not  visualized. Mild aortic valve sclerosis is present, with no evidence of  aortic valve stenosis.   7. Aortic dilatation noted. There is mild dilatation of the ascending  aorta, measuring 40 mm.   8. The inferior vena cava is dilated in size with >50% respiratory  variability, suggesting right atrial pressure of 8 mmHg.   Outside Nuc 07/2019  IMPRESSION:  1.  Somewhat dilated left ventricle on both stress and rest images there  is evidence of an old inferior wall myocardial infarction with minor  peri-infarct reversibility specially around the posterior wall area.    There appears to be no  significant reversible ischemia.   2.  The calculated ejection fraction is listed as 26%.    3.  Old inferior wall and posterior wall myocardial infarction  with minor  peri-infarct reversibility.   Stress Echo 10/2017 IMPRESSIONS   Stress echo is nondiagnostic for ischemia at 4.6 METS and 66% MPHR;   Mildly dilated LV size with mild to moderate LV systolic dysfunction, estimated LVEF 35-40% with severe hypokinesis of the   inferoposterior wall.   Recommend cardiac catheterization for further evaluation of newly diagnosed LV dysfunction.   Stress Results   Protocol: Bruce Exercise Duration (min:sec): 2:00 METS: 4.6   Resting HR: 58 Resting BP: 130 / 70 Max Predicted HR: 163 Target HR: 139   Peak HR: 107 Peak BP: 162 / 80 66 % Max Predicted HR Double Product: 40981   Stress Summary: The patient's target heart rate was not achieved   BP Response: Normal   Reason for Termination: Excessive shortness of breath   Cardiac Symptoms: Dyspnea   Cardiac Cath 10/2017 PROCEDURE:  Left heart cath, coronary angiography, left ventriculography  and LAD coronary stenting.     Benefits, alternatives and risks of the procedure were explained to the  patient and informed consent was obtained.     ANESTHESIA:  Local with conscious sedation.  Patient assessed post  sedation/anesthesia and tolerated well.  Vital signs stable and no  complications noted.     The patient was brought to the cath lab and was prepped and draped in the  normal sterile technique.  The right femoral artery was cannulated and a  53f sheath was inserted.  We used diagnostic catheters for right and left  coronary angiography and left ventriculography.       The decision was made to proceed with intervention of the LAD artery.  A  xb guide was used to cannulate the LMT.   The patient was given AngioMax.    The lesion was crossed using prowater wire and angioplastied with a 2.5  balloon and then stented using a 3.0/20 synergy  stent.  The stent was  deployed at post nominal atmospheres.  The stent was postdilated.  The  balloon and wire were withdrawn.  Repeat imaging was performed which  showed excellent angiographic results, so the guiding catheter was  removed.  The patient was moved to the floor in stable condition.  There  were no complications.     HEMODYNAMIC / ANGIOGRAPHIC DATA:    1. Left ventricular end diastolic pressure was 12 mmHg.  2. Left ventriculography revealed an EF around 55%.    3. The left main is 0.  4. The left anterior descending artery is 80.  5. The left circumflex is 10.  6. The right coronary artery is 10.  7. Successful stenting of the prox LAD, with reduction of stenosis from  80% to 0%.     ESTIMATED BLOOD LOSS:  Minimal     SPECIMENS:  NONE     RECOMMENDATIONS:  Post-procedure care will focus on prevention of any  ischemic events and congestive complications.  Aggressive risk factor  modification will be carried out. Dual antiplatelet therapy emphasized.     Nita Sickle, MD, [facility]  10/29/2017, 2:19 PM         Laboratory Data:  High Sensitivity Troponin:   Recent Labs  Lab 10/30/20 2129 10/30/20 2306  TROPONINIHS 47* 47*     Chemistry Recent Labs  Lab 10/30/20 2129 10/31/20 1340 11/01/20 0429  NA 138 137 137  K 5.3* 4.9 4.7  CL 108 104 106  CO2 23 26 27   GLUCOSE 195* 260* 141*  BUN 20 22* 28*  CREATININE 1.76* 1.66*  1.74* 1.99*  CALCIUM 8.8* 8.9 8.5*  GFRNONAA 44* 47*  44* 38*  ANIONGAP 7 7 4*    Recent Labs  Lab 10/30/20 2129  PROT 6.1*  ALBUMIN 2.9*  AST 14*  ALT 12  ALKPHOS 71  BILITOT 0.4   Hematology Recent Labs  Lab 10/30/20 2129 10/31/20 1340 11/01/20 0429  WBC 8.3 7.6 6.4  RBC 3.62* 3.84* 3.54*  HGB 11.7* 12.3* 11.4*  HCT 34.9* 38.5* 35.2*  MCV 96.4 100.3* 99.4  MCH 32.3 32.0 32.2  MCHC 33.5 31.9 32.4  RDW 13.8 14.0 13.7  PLT 211 200 197   BNP Recent Labs  Lab 10/30/20 2129  BNP 1,201.8*    DDimer  Recent  Labs  Lab 10/31/20 1340  DDIMER 1.09*     Radiology/Studies:  NM Pulmonary Perfusion  Result Date: 10/31/2020 CLINICAL DATA:  Chest pain and shortness of breath EXAM: NUCLEAR MEDICINE PERFUSION LUNG SCAN TECHNIQUE: Perfusion images were obtained in multiple projections after intravenous injection of radiopharmaceutical. Ventilation scans intentionally deferred if perfusion scan and chest x-ray adequate for interpretation during COVID 19 epidemic. RADIOPHARMACEUTICALS:  4.3 mCi Tc-62m MAA IV COMPARISON:  Chest x-ray from the previous day. FINDINGS: There is adequate uptake of radioactive tracer throughout both lungs on perfusion images. No wedge-shaped defects are identified to suggest pulmonary embolism. IMPRESSION: No evidence of pulmonary embolism. Electronically Signed   By: Alcide Clever M.D.   On: 10/31/2020 19:52   DG Chest Port 1 View  Result Date: 10/30/2020 CLINICAL DATA:  Chest pain for 2 days EXAM: PORTABLE CHEST 1 VIEW COMPARISON:  07/16/2018 FINDINGS: Cardiac shadow is mildly enlarged but accentuated by the portable technique. Aortic calcifications are noted. The lungs are well aerated bilaterally. Mild vascular congestion is noted centrally without interstitial edema. No focal infiltrate is noted. No acute bony abnormality is seen. IMPRESSION: Mild vascular congestion without interstitial edema. Electronically Signed   By: Alcide Clever M.D.   On: 10/30/2020 21:35   ECHOCARDIOGRAM COMPLETE  Result Date: 10/31/2020    ECHOCARDIOGRAM REPORT   Patient Name:   Eddie Foster Date of Exam: 10/31/2020 Medical Rec #:  [id number]         Height:       69.0 in Accession #:    [id number]        Weight:       186.4 lb Date of Birth:  09/13/1960         BSA:          2.005 m Patient Age:    59 years          BP:           157/101 mmHg Patient Gender: M                 HR:           63 bpm. Exam Location:  Inpatient Procedure: 2D Echo, 3D Echo, Cardiac Doppler and Color Doppler Indications:     I50.40* Unspecified combined systolic (congestive) and diastolic                 (congestive) heart failure  History:        Patient has no prior history of Echocardiogram examinations.                 CHF, CAD; Risk Factors:Diabetes.  Sonographer:    Sheralyn Boatman RDCS Referring Phys: [phone] Meryle Ready [facility] IMPRESSIONS  1. Left ventricular ejection fraction,  by estimation, is 25%. The left ventricle has severely decreased function. The left ventricle demonstrates global hypokinesis. The left ventricular internal cavity size was mildly dilated. There is mild left ventricular hypertrophy. Left ventricular diastolic parameters are consistent with Grade II diastolic dysfunction (pseudonormalization).  2. Right ventricular systolic function is mildly reduced. The right ventricular size is normal. There is moderately elevated pulmonary artery systolic pressure. The estimated right ventricular systolic pressure is 50.0 mmHg.  3. Left atrial size was moderately dilated.  4. Right atrial size was mildly dilated.  5. The mitral valve is normal in structure. Trivial mitral valve regurgitation. No evidence of mitral stenosis.  6. The aortic valve is tricuspid. Aortic valve regurgitation is not visualized. Mild aortic valve sclerosis is present, with no evidence of aortic valve stenosis.  7. Aortic dilatation noted. There is mild dilatation of the ascending aorta, measuring 40 mm.  8. The inferior vena cava is dilated in size with >50% respiratory variability, suggesting right atrial pressure of 8 mmHg. FINDINGS  Left Ventricle: Left ventricular ejection fraction, by estimation, is 25%. The left ventricle has severely decreased function. The left ventricle demonstrates global hypokinesis. The left ventricular internal cavity size was mildly dilated. There is mild left ventricular hypertrophy. Left ventricular diastolic parameters are consistent with Grade II diastolic dysfunction (pseudonormalization). Right Ventricle: The right  ventricular size is normal. No increase in right ventricular wall thickness. Right ventricular systolic function is mildly reduced. There is moderately elevated pulmonary artery systolic pressure. The tricuspid regurgitant velocity is 3.24 m/s, and with an assumed right atrial pressure of 8 mmHg, the estimated right ventricular systolic pressure is 50.0 mmHg. Left Atrium: Left atrial size was moderately dilated. Right Atrium: Right atrial size was mildly dilated. Pericardium: Trivial pericardial effusion is present. Mitral Valve: The mitral valve is normal in structure. Mild mitral annular calcification. Trivial mitral valve regurgitation. No evidence of mitral valve stenosis. Tricuspid Valve: The tricuspid valve is normal in structure. Tricuspid valve regurgitation is trivial. Aortic Valve: The aortic valve is tricuspid. Aortic valve regurgitation is not visualized. Mild aortic valve sclerosis is present, with no evidence of aortic valve stenosis. Pulmonic Valve: The pulmonic valve was normal in structure. Pulmonic valve regurgitation is not visualized. Aorta: The aortic root is normal in size and structure and aortic dilatation noted. There is mild dilatation of the ascending aorta, measuring 40 mm. Venous: The inferior vena cava is dilated in size with greater than 50% respiratory variability, suggesting right atrial pressure of 8 mmHg. IAS/Shunts: No atrial level shunt detected by color flow Doppler.  LEFT VENTRICLE PLAX 2D LVIDd:         5.70 cm      Diastology LVIDs:         5.10 cm      LV e' medial:    4.79 cm/s LV PW:         1.60 cm      LV E/e' medial:  20.9 LV IVS:        1.50 cm      LV e' lateral:   5.05 cm/s LVOT diam:     2.30 cm      LV E/e' lateral: 19.8 LV SV:         86 LV SV Index:   43 LVOT Area:     4.15 cm  LV Volumes (MOD) LV vol d, MOD A2C: 161.0 ml LV vol d, MOD A4C: 180.0 ml LV vol s, MOD A2C: 126.0 ml LV vol s,  MOD A4C: 134.0 ml LV SV MOD A2C:     35.0 ml LV SV MOD A4C:     180.0 ml LV  SV MOD BP:      44.2 ml RIGHT VENTRICLE             IVC RV S prime:     12.60 cm/s  IVC diam: 2.40 cm TAPSE (M-mode): 1.7 cm LEFT ATRIUM           Index       RIGHT ATRIUM           Index LA diam:      4.60 cm 2.29 cm/m  RA Area:     18.50 cm LA Vol (A2C): 83.9 ml 41.84 ml/m RA Volume:   56.60 ml  28.23 ml/m LA Vol (A4C): 52.1 ml 25.98 ml/m  AORTIC VALVE LVOT Vmax:   101.00 cm/s LVOT Vmean:  66.100 cm/s LVOT VTI:    0.206 m  AORTA Ao Root diam: 3.40 cm Ao Asc diam:  4.00 cm MITRAL VALVE                TRICUSPID VALVE MV Area (PHT): 3.61 cm     TR Peak grad:   42.0 mmHg MV Decel Time: 210 msec     TR Vmax:        324.00 cm/s MV E velocity: 100.00 cm/s MV A velocity: 34.30 cm/s   SHUNTS MV E/A ratio:  2.92         Systemic VTI:  0.21 m                             Systemic Diam: 2.30 cm Marca Anconaalton Mclean MD Electronically signed by Marca Anconaalton Mclean MD Signature Date/Time: 10/31/2020/5:20:10 PM    Final    VAS US LOWER EXTREMITY VENOUS (DVT)  Result Date: 10/31/2020  Lower Venous DVT Study Patient Name:  Eddie Foster  Date of Exam:   10/31/2020 Medical Rec #: [id number]          Accession #:    [id number] Date of Birth: 12/29/1960          Patient Gender: M Patient Age:   59Y [facility] Procedure:      VAS US LOWER EXTREMITY VENOUS (DVT) Referring Phys: [id number] Eduard ClosARSHAD N KAKRAKANDY --------------------------------------------------------------------------------  Indications: SOB. Other Indications: Acute CHF & CKD3 with BLE edema. Comparison Study: No previous exams Performing Technologist: Jody Hill RVT, RDMS  Examination Guidelines: A complete evaluation includes B-mode imaging, spectral Doppler, color Doppler, and power Doppler as needed of all accessible portions of each vessel. Bilateral testing is considered an integral part of a complete examination. Limited examinations for reoccurring indications may be performed as noted. The reflux portion of the exam is performed with the patient  in reverse Trendelenburg.  +---------+---------------+---------+-----------+----------+--------------+ RIGHT    CompressibilityPhasicitySpontaneityPropertiesThrombus Aging +---------+---------------+---------+-----------+----------+--------------+ CFV      Full           Yes      Yes                                 +---------+---------------+---------+-----------+----------+--------------+ SFJ      Full                                                        +---------+---------------+---------+-----------+----------+--------------+  FV Prox  Full           Yes      Yes                                 +---------+---------------+---------+-----------+----------+--------------+ FV Mid   Full           Yes      Yes                                 +---------+---------------+---------+-----------+----------+--------------+ FV DistalFull           Yes      Yes                                 +---------+---------------+---------+-----------+----------+--------------+ PFV      Full                                                        +---------+---------------+---------+-----------+----------+--------------+ POP      Full           Yes      Yes                                 +---------+---------------+---------+-----------+----------+--------------+ PTV      Full                                                        +---------+---------------+---------+-----------+----------+--------------+ PERO     Full                                                        +---------+---------------+---------+-----------+----------+--------------+   +---------+---------------+---------+-----------+----------+--------------+ LEFT     CompressibilityPhasicitySpontaneityPropertiesThrombus Aging +---------+---------------+---------+-----------+----------+--------------+ CFV      Full           Yes      Yes                                  +---------+---------------+---------+-----------+----------+--------------+ SFJ      Full                                                        +---------+---------------+---------+-----------+----------+--------------+ FV Prox  Full           Yes      Yes                                 +---------+---------------+---------+-----------+----------+--------------+ FV Mid   Full  Yes      Yes                                 +---------+---------------+---------+-----------+----------+--------------+ FV DistalFull           Yes      Yes                                 +---------+---------------+---------+-----------+----------+--------------+ PFV      Full                                                        +---------+---------------+---------+-----------+----------+--------------+ POP      Full           Yes      Yes                                 +---------+---------------+---------+-----------+----------+--------------+ PTV      Full                                                        +---------+---------------+---------+-----------+----------+--------------+ PERO     Full                                                        +---------+---------------+---------+-----------+----------+--------------+     Summary: BILATERAL: - No evidence of deep vein thrombosis seen in the lower extremities, bilaterally. - No evidence of superficial venous thrombosis in the lower extremities, bilaterally. -No evidence of popliteal cyst, bilaterally.   *See table(s) above for measurements and observations. Electronically signed by Heath Lark on 10/31/2020 at 5:15:12 PM.    Final      Assessment and Plan:   1. 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 - at this time it's not clear to me if cardiomyopathy was previously deemed ischemic or nonischemic - EF was 35-40% by stress echo 10/2017 but reported to be normal by cath at same time, then EF 26% by nuc in  07/2019  - EF remains persistently low on echo here - need to optimize med regimen further - will hold carvedilol this AM pending confirmation of continuation with MD given positive cocaine use - patient counseled on importance of absolute avoidance of cocaine - hold lisinopril/HCTZ this AM pending discussion with MD about potential transition to [facility] +/- adjustment of Lasix dosing - hold off on spironolactone given hyperkalemia on admission, will also need to follow with GDMT regardless - will discuss whether repeat ischemic eval is needed with MD as long term issue also pertains to whether ICD would be warranted - do not suspect he would be a candidate for such unless substance abuse was addressed  2. CAD s/p PCI to LAD 2019 - hsTroponin low/flat, suggestive of demand ischemia - on Plavix monotherapy by primary cardiologist - check lipids in  AM and consider statin titration if LDL <70  3. Essential HTN - manage in context above - plenty of room to work with meds  4. CKD stage IIIb - prior Cr was 2.0 in 07/2020 so remains within reasonable baseline  5. Polysubstance abuse with ETOH/cocaine - cessation will be essential for prognosis  6. Mild dilation of ascending aorta - anticipate OP f/u  7. Hypomagnesemia - repleted 7/14, lyte management per primary team  Risk Assessment/Risk Scores:      New [employer] Heart Association (NYHA) Functional Class NYHA Class III  For questions or updates, please contact [facility] Please consult [email or web address] for contact info under    Signed, Laurann Montana, PA-C beginning note prepped remotely/HAW 11/01/2020 8:58 AM

## 2020-11-02 DIAGNOSIS — I2510 Atherosclerotic heart disease of native coronary artery without angina pectoris: Secondary | ICD-10-CM

## 2020-11-02 LAB — LIPID PANEL
Cholesterol: 168 mg/dL (ref 0–200)
HDL: 59 mg/dL (ref >40)
LDL Cholesterol: 83 mg/dL (ref 0–99)
Total CHOL/HDL Ratio: 2.8 RATIO
Triglycerides: 128 mg/dL (ref <150)
VLDL: 26 mg/dL (ref 0–40)

## 2020-11-02 LAB — BASIC METABOLIC PANEL
Anion gap: 5 (ref 5–15)
BUN: 30 mg/dL — ABNORMAL HIGH (ref 6–20)
CO2: 30 mmol/L (ref 22–32)
Calcium: 8.8 mg/dL — ABNORMAL LOW (ref 8.9–10.3)
Chloride: 105 mmol/L (ref 98–111)
Creatinine, Ser: 1.88 mg/dL — ABNORMAL HIGH (ref 0.61–1.24)
GFR, Estimated: 40 mL/min — ABNORMAL LOW (ref >60)
Glucose, Bld: 181 mg/dL — ABNORMAL HIGH (ref 70–99)
Potassium: 4.5 mmol/L (ref 3.5–5.1)
Sodium: 140 mmol/L (ref 135–145)

## 2020-11-02 LAB — GLUCOSE, CAPILLARY
Glucose-Capillary: 191 mg/dL — ABNORMAL HIGH (ref 70–99)
Glucose-Capillary: 171 mg/dL — ABNORMAL HIGH (ref 70–99)

## 2020-11-02 LAB — MAGNESIUM: Magnesium: 1.9 mg/dL (ref 1.7–2.4)

## 2020-11-02 MED ORDER — FUROSEMIDE 20 MG PO TABS: 20.0000 mg | ORAL_TABLET | Freq: Every day | ORAL | 0 refills | Status: AC

## 2020-11-02 MED ORDER — SACUBITRIL-VALSARTAN 24-26 MG PO TABS: 1.0000 | ORAL_TABLET | Freq: Two times a day (BID) | ORAL | 0 refills | Status: AC

## 2020-11-02 NOTE — Discharge Summary (Signed)
Physician Discharge Summary  Eddie Foster ZOX:[id number] DOB: 03-Jun-1960 DOA: 10/30/2020  PCP: [facility]  Admit date: 10/30/2020 Discharge date: 11/02/2020  Admitted From: home Disposition:  home  Recommendations for Outpatient Follow-up:  Follow up with PCP in 1-2 weeks, follow-up with your cardiologist at [facility] Health:no  Equipment/Devices: none  Discharge Condition: Stable Code Status:   Code Status: Full Code Diet recommendation:  Diet Order             Diet - low sodium heart healthy           Diet heart healthy/carb modified Room service appropriate? Yes; Fluid consistency: Thin; Fluid restriction: 1200 mL Fluid  Diet effective now                    Brief/Interim Summary: 60 year old male with CAD history of stent contesting, CKD stage III, anemia diabetes alcoholism history of drug abuse admitted with shortness of breath and Blood x2 to 3 days, no chest pain but some nonproductive cough and flulike symptoms. In the ED low-grade temp 100 chest x-ray concerning for CHF, interstitial edema labs with creatinine 1.7 potassium 5.3 Lasix was given along with nitroglycerin patch and admitted.  COVID-19 negative Patient was seen by cardiologist.  Patient was aggressively diuresed at this time volume status is improved.  Doing well on room air ambulated no chest pain no orthopnea no shortness of breath. Patient is transition to Coreg and [place] and educated on diet and lifestyle.  He will need to follow-up with cardiology to uptitrate his medication as outpatient.  Discharge Diagnoses:  Acute combined systolic and diastolic CHF Echo this admission showed EF 25%, global hypokinesis LV, grade 2 diastolic dysfunction.  Previously, 35-40% in July 2019 and felt to be nonischemic- EF 26% in 07/2019 and ef remains low. Cardiology input appreciated continue Lasix 20 mg daily, Entresto, Coreg , and needs outpatient follow-up with cardiology to  uptitrate the medication.  He is stable for discharge  Intake/Output Summary (Last 24 hours) at 11/02/2020 1045 Last data filed at 11/02/2020 0538 Gross per 24 hour  Intake 600 ml  Output 1800 ml  Net -1200 ml   Wt Readings from Last 3 Encounters:  11/02/20 84.1 kg  03/11/20 90.7 kg  07/25/18 94.8 kg   Positive D-dimer duplex negative pulmonary perfusion, no evidence of PE  Hypertension stable cont coreg. Off lisinopril HCTZ CAD with history of stent continue his Plavix Coreg statin.  Elevated troponin flat no delta likely from systolic CHF.  CKD stage IIIb: Creatinine 1.6. previous creat at 2.0 in April 2022. Monitor. Recent Labs  Lab 10/30/20 2129 10/31/20 1340 11/01/20 0429 11/02/20 0447  BUN 20 22* 28* 30*  CREATININE 1.76* 1.66*  1.74* 1.99* 1.88*    Chronic anemia:stable,suspect anemia of chronic renal disease hemoglobin is stable.Monitor Hypomagnesemia repleted DMT2: Hemoglobin A1c 6.8.  Blood sugar stable.  Outpatient follow-up to adjust medication Recent Labs  Lab 11/01/20 1116 11/01/20 1606 11/01/20 1653 11/01/20 2206 11/02/20 0718  GLUCAP 183* 51* 134* 145* 191*   Alcohol abuse drinks 4-5 blunts per every day no signs of withdrawal currently.  Cessation advised.   Cocaine positive in urine drug screen.  admits using cocaine a week ago- but reports he has "quit". Mil dilatation of Asc Colon, needs OP fu     Consults: cardiology  Subjective: Alert awake oriented, wants to go home today. Discharge Exam: Vitals:   11/01/20 2015 11/02/20 0534  BP: Marland Kitchen)  145/88 (!) 154/88  Pulse: 65 63  Resp: 15 16  Temp: 97.7 F (36.5 C) 97.6 F (36.4 C)  SpO2: 100% 100%   General: Pt is alert, awake, not in acute distress Cardiovascular: RRR, S1/S2 +, no rubs, no gallops Respiratory: CTA bilaterally, no wheezing, no rhonchi Abdominal: Soft, NT, ND, bowel sounds + Extremities: no edema, no cyanosis  Discharge Instructions  Discharge Instructions     (HEART  FAILURE PATIENTS) Call MD:  Anytime you have any of the following symptoms: 1) 3 pound weight gain in 24 hours or 5 pounds in 1 week 2) shortness of breath, with or without a dry hacking cough 3) swelling in the hands, feet or stomach 4) if you have to sleep on extra pillows at night in order to breathe.   Complete by: As directed    Diet - low sodium heart healthy   Complete by: As directed    Discharge instructions   Complete by: As directed    Follow-up with your cardiologist at [facility] in 1 wk to get labs and adjust meds  Please call call MD or return to ER for similar or worsening recurring problem that brought you to hospital or if any fever,nausea/vomiting,abdominal pain, uncontrolled pain, chest pain,  shortness of breath or any other alarming symptoms.  Please follow-up your doctor as instructed in a week time and call the office for appointment.  Please avoid alcohol, smoking, or any other illicit substance and maintain healthy habits including taking your regular medications as prescribed.  You were cared for by a hospitalist during your hospital stay. If you have any questions about your discharge medications or the care you received while you were in the hospital after you are discharged, you can call the unit and ask to speak with the hospitalist on call if the hospitalist that took care of you is not available.  Once you are discharged, your primary care physician will handle any further medical issues. Please note that NO REFILLS for any discharge medications will be authorized once you are discharged, as it is imperative that you return to your primary care physician (or establish a relationship with a primary care physician if you do not have one) for your aftercare needs so that they can reassess your need for medications and monitor your lab values   Increase activity slowly   Complete by: As directed       Allergies as of 11/02/2020       Reactions   Shrimp Extract  Allergy Skin Test Itching, Swelling        Medication List     STOP taking these medications    HYDROcodone-acetaminophen 5-325 MG tablet Commonly known as: NORCO/VICODIN   lisinopril-hydrochlorothiazide 20-12.5 MG tablet Commonly known as: ZESTORETIC   meclizine 25 MG tablet Commonly known as: ANTIVERT   methocarbamol 500 MG tablet Commonly known as: ROBAXIN   naproxen 500 MG tablet Commonly known as: NAPROSYN   ondansetron 4 MG disintegrating tablet Commonly known as: Zofran ODT       TAKE these medications    atorvastatin 20 MG tablet Commonly known as: LIPITOR Take 20 mg by mouth daily.   carvedilol 6.25 MG tablet Commonly known as: COREG Take 6.25 mg by mouth 2 (two) times daily.   clopidogrel 75 MG tablet Commonly known as: PLAVIX Take 75 mg by mouth daily.   furosemide 20 MG tablet Commonly known as: LASIX Take 1 tablet (20 mg total) by mouth daily. What changed:  medication strength how much to take when to take this   gabapentin 100 MG capsule Commonly known as: NEURONTIN Take 100 mg by mouth 3 (three) times daily.   insulin aspart protamine- aspart (70-30) 100 UNIT/ML injection Commonly known as: NOVOLOG MIX 70/30 Inject 15 Units into the skin 2 (two) times daily.   sacubitril-valsartan 24-26 MG Commonly known as: ENTRESTO Take 1 tablet by mouth 2 (two) times daily.   sildenafil 100 MG tablet Commonly known as: VIAGRA Take 100 mg by mouth daily as needed for erectile dysfunction.   Vitamin D (Ergocalciferol) 1.25 MG (50000 UNIT) Caps capsule Commonly known as: DRISDOL Take 50,000 Units by mouth every Monday.        Follow-up Information     [facility] Follow up in 1 week(s).   Specialty: Pediatrics Contact information: [address] [place] [address] [phone]         Merleen Millinerheek, Herman Barrett, MD Follow up in 1 week(s).   Specialty: Cardiology Why: Follow-up with the cardiology  clinic at [facility] Contact information: [address] [place] [address] [phone]                Allergies  Allergen Reactions   Shrimp Extract Allergy Skin Test Itching and Swelling    The results of significant diagnostics from this hospitalization (including imaging, microbiology, ancillary and laboratory) are listed below for reference.    Microbiology: Recent Results (from the past 240 hour(s))  Resp Panel by RT-PCR (Flu A&B, Covid) Nasopharyngeal Swab     Status: None   Collection Time: 10/30/20  9:29 PM   Specimen: Nasopharyngeal Swab; Nasopharyngeal(NP) swabs in vial transport medium  Result Value Ref Range Status   SARS Coronavirus 2 by RT PCR NEGATIVE NEGATIVE Final    Comment: (NOTE) SARS-CoV-2 target nucleic acids are NOT DETECTED.  The SARS-CoV-2 RNA is generally detectable in upper respiratory specimens during the acute phase of infection. The lowest concentration of SARS-CoV-2 viral copies this assay can detect is 138 copies/mL. A negative result does not preclude SARS-Cov-2 infection and should not be used as the sole basis for treatment or other patient management decisions. A negative result may occur with  improper specimen collection/handling, submission of specimen other than nasopharyngeal swab, presence of viral mutation(s) within the areas targeted by this assay, and inadequate number of viral copies(<138 copies/mL). A negative result must be combined with clinical observations, patient history, and epidemiological information. The expected result is Negative.  Fact Sheet for Patients:  BloggerCourse.comhttps://www.fda.gov/media/152166/download  Fact Sheet for Healthcare Providers:  SeriousBroker.ithttps://www.fda.gov/media/152162/download  This test is no t yet approved or cleared by the [place] FDA and  has been authorized for detection and/or diagnosis of SARS-CoV-2 by FDA under an Emergency Use Authorization (EUA). This EUA will remain  in  effect (meaning this test can be used) for the duration of the COVID-19 declaration under Section 564(b)(1) of the Act, 21 U.S.C.section 360bbb-3(b)(1), unless the authorization is terminated  or revoked sooner.       Influenza A by PCR NEGATIVE NEGATIVE Final   Influenza B by PCR NEGATIVE NEGATIVE Final    Comment: (NOTE) The Xpert Xpress SARS-CoV-2/FLU/RSV plus assay is intended as an aid in the diagnosis of influenza from Nasopharyngeal swab specimens and should not be used as a sole basis for treatment. Nasal washings and aspirates are unacceptable for Xpert Xpress SARS-CoV-2/FLU/RSV testing.  Fact Sheet for Patients: BloggerCourse.comhttps://www.fda.gov/media/152166/download  Fact Sheet for  Healthcare Providers: SeriousBroker.it  This test is not yet approved or cleared by the [place] and has been authorized for detection and/or diagnosis of SARS-CoV-2 by FDA under an Emergency Use Authorization (EUA). This EUA will remain in effect (meaning this test can be used) for the duration of the COVID-19 declaration under Section 564(b)(1) of the Act, 21 U.S.C. section 360bbb-3(b)(1), unless the authorization is terminated or revoked.  Performed at [facility], [address] [place], [place] [address]     Procedures/Studies: [place] Pulmonary Perfusion  Result Date: 10/31/2020 CLINICAL DATA:  Chest pain and shortness of breath EXAM: NUCLEAR MEDICINE PERFUSION LUNG SCAN TECHNIQUE: Perfusion images were obtained in multiple projections after intravenous injection of radiopharmaceutical. Ventilation scans intentionally deferred if perfusion scan and chest x-ray adequate for interpretation during COVID 19 epidemic. RADIOPHARMACEUTICALS:  4.3 mCi Tc-43m MAA IV COMPARISON:  Chest x-ray from the previous day. FINDINGS: There is adequate uptake of radioactive tracer throughout both lungs on perfusion images. No wedge-shaped defects are identified to suggest  pulmonary embolism. IMPRESSION: No evidence of pulmonary embolism. Electronically Signed   By: Alcide Clever M.D.   On: 10/31/2020 19:52   DG Chest Port 1 View  Result Date: 10/30/2020 CLINICAL DATA:  Chest pain for 2 days EXAM: PORTABLE CHEST 1 VIEW COMPARISON:  07/16/2018 FINDINGS: Cardiac shadow is mildly enlarged but accentuated by the portable technique. Aortic calcifications are noted. The lungs are well aerated bilaterally. Mild vascular congestion is noted centrally without interstitial edema. No focal infiltrate is noted. No acute bony abnormality is seen. IMPRESSION: Mild vascular congestion without interstitial edema. Electronically Signed   By: Alcide Clever M.D.   On: 10/30/2020 21:35   ECHOCARDIOGRAM COMPLETE  Result Date: 10/31/2020    ECHOCARDIOGRAM REPORT   Patient Name:   Eddie Foster Date of Exam: 10/31/2020 Medical Rec #:  [id number]         Height:       69.0 in Accession #:    [id number]        Weight:       186.4 lb Date of Birth:  Aug 23, 1960         BSA:          2.005 m Patient Age:    59 years          BP:           157/101 mmHg Patient Gender: M                 HR:           63 bpm. Exam Location:  Inpatient Procedure: 2D Echo, 3D Echo, Cardiac Doppler and Color Doppler Indications:    I50.40* Unspecified combined systolic (congestive) and diastolic                 (congestive) heart failure  History:        Patient has no prior history of Echocardiogram examinations.                 CHF, CAD; Risk Factors:Diabetes.  Sonographer:    Sheralyn Boatman RDCS Referring Phys: [id number] [facility] IMPRESSIONS  1. Left ventricular ejection fraction, by estimation, is 25%. The left ventricle has severely decreased function. The left ventricle demonstrates global hypokinesis. The left ventricular internal cavity size was mildly dilated. There is mild left ventricular hypertrophy. Left ventricular diastolic parameters are consistent with Grade II diastolic dysfunction (pseudonormalization).  2.  Right ventricular systolic  function is mildly reduced. The right ventricular size is normal. There is moderately elevated pulmonary artery systolic pressure. The estimated right ventricular systolic pressure is 50.0 mmHg.  3. Left atrial size was moderately dilated.  4. Right atrial size was mildly dilated.  5. The mitral valve is normal in structure. Trivial mitral valve regurgitation. No evidence of mitral stenosis.  6. The aortic valve is tricuspid. Aortic valve regurgitation is not visualized. Mild aortic valve sclerosis is present, with no evidence of aortic valve stenosis.  7. Aortic dilatation noted. There is mild dilatation of the ascending aorta, measuring 40 mm.  8. The inferior vena cava is dilated in size with >50% respiratory variability, suggesting right atrial pressure of 8 mmHg. FINDINGS  Left Ventricle: Left ventricular ejection fraction, by estimation, is 25%. The left ventricle has severely decreased function. The left ventricle demonstrates global hypokinesis. The left ventricular internal cavity size was mildly dilated. There is mild left ventricular hypertrophy. Left ventricular diastolic parameters are consistent with Grade II diastolic dysfunction (pseudonormalization). Right Ventricle: The right ventricular size is normal. No increase in right ventricular wall thickness. Right ventricular systolic function is mildly reduced. There is moderately elevated pulmonary artery systolic pressure. The tricuspid regurgitant velocity is 3.24 m/s, and with an assumed right atrial pressure of 8 mmHg, the estimated right ventricular systolic pressure is 50.0 mmHg. Left Atrium: Left atrial size was moderately dilated. Right Atrium: Right atrial size was mildly dilated. Pericardium: Trivial pericardial effusion is present. Mitral Valve: The mitral valve is normal in structure. Mild mitral annular calcification. Trivial mitral valve regurgitation. No evidence of mitral valve stenosis. Tricuspid Valve: The  tricuspid valve is normal in structure. Tricuspid valve regurgitation is trivial. Aortic Valve: The aortic valve is tricuspid. Aortic valve regurgitation is not visualized. Mild aortic valve sclerosis is present, with no evidence of aortic valve stenosis. Pulmonic Valve: The pulmonic valve was normal in structure. Pulmonic valve regurgitation is not visualized. Aorta: The aortic root is normal in size and structure and aortic dilatation noted. There is mild dilatation of the ascending aorta, measuring 40 mm. Venous: The inferior vena cava is dilated in size with greater than 50% respiratory variability, suggesting right atrial pressure of 8 mmHg. IAS/Shunts: No atrial level shunt detected by color flow Doppler.  LEFT VENTRICLE PLAX 2D LVIDd:         5.70 cm      Diastology LVIDs:         5.10 cm      LV e' medial:    4.79 cm/s LV PW:         1.60 cm      LV E/e' medial:  20.9 LV IVS:        1.50 cm      LV e' lateral:   5.05 cm/s LVOT diam:     2.30 cm      LV E/e' lateral: 19.8 LV SV:         86 LV SV Index:   43 LVOT Area:     4.15 cm  LV Volumes (MOD) LV vol d, MOD A2C: 161.0 ml LV vol d, MOD A4C: 180.0 ml LV vol s, MOD A2C: 126.0 ml LV vol s, MOD A4C: 134.0 ml LV SV MOD A2C:     35.0 ml LV SV MOD A4C:     180.0 ml LV SV MOD BP:      44.2 ml RIGHT VENTRICLE  IVC RV S prime:     12.60 cm/s  IVC diam: 2.40 cm TAPSE (M-mode): 1.7 cm LEFT ATRIUM           Index       RIGHT ATRIUM           Index LA diam:      4.60 cm 2.29 cm/m  RA Area:     18.50 cm LA Vol (A2C): 83.9 ml 41.84 ml/m RA Volume:   56.60 ml  28.23 ml/m LA Vol (A4C): 52.1 ml 25.98 ml/m  AORTIC VALVE LVOT Vmax:   101.00 cm/s LVOT Vmean:  66.100 cm/s LVOT VTI:    0.206 m  AORTA Ao Root diam: 3.40 cm Ao Asc diam:  4.00 cm MITRAL VALVE                TRICUSPID VALVE MV Area (PHT): 3.61 cm     TR Peak grad:   42.0 mmHg MV Decel Time: 210 msec     TR Vmax:        324.00 cm/s MV E velocity: 100.00 cm/s MV A velocity: 34.30 cm/s   SHUNTS MV E/A  ratio:  2.92         Systemic VTI:  0.21 m                             Systemic Diam: 2.30 cm Marca Ancona MD Electronically signed by Marca Ancona MD Signature Date/Time: 10/31/2020/5:20:10 PM    Final    VAS [place] LOWER EXTREMITY VENOUS (DVT)  Result Date: 10/31/2020  Lower Venous DVT Study Patient Name:  Eddie Foster  Date of Exam:   10/31/2020 Medical Rec #: [id number]          Accession #:    [id number] Date of Birth: 12-27-1960          Patient Gender: M Patient Age:   59Y Exam Location:  [facility] Procedure:      VAS [place] LOWER EXTREMITY VENOUS (DVT) Referring Phys: [id number] Eduard Clos --------------------------------------------------------------------------------  Indications: SOB. Other Indications: Acute CHF & CKD3 with BLE edema. Comparison Study: No previous exams Performing Technologist: Jody Hill RVT, RDMS  Examination Guidelines: A complete evaluation includes B-mode imaging, spectral Doppler, color Doppler, and power Doppler as needed of all accessible portions of each vessel. Bilateral testing is considered an integral part of a complete examination. Limited examinations for reoccurring indications may be performed as noted. The reflux portion of the exam is performed with the patient in reverse Trendelenburg.  +---------+---------------+---------+-----------+----------+--------------+ RIGHT    CompressibilityPhasicitySpontaneityPropertiesThrombus Aging +---------+---------------+---------+-----------+----------+--------------+ CFV      Full           Yes      Yes                                 +---------+---------------+---------+-----------+----------+--------------+ SFJ      Full                                                        +---------+---------------+---------+-----------+----------+--------------+ FV Prox  Full           Yes      Yes                                  +---------+---------------+---------+-----------+----------+--------------+  FV Mid   Full           Yes      Yes                                 +---------+---------------+---------+-----------+----------+--------------+ FV DistalFull           Yes      Yes                                 +---------+---------------+---------+-----------+----------+--------------+ PFV      Full                                                        +---------+---------------+---------+-----------+----------+--------------+ POP      Full           Yes      Yes                                 +---------+---------------+---------+-----------+----------+--------------+ PTV      Full                                                        +---------+---------------+---------+-----------+----------+--------------+ PERO     Full                                                        +---------+---------------+---------+-----------+----------+--------------+   +---------+---------------+---------+-----------+----------+--------------+ LEFT     CompressibilityPhasicitySpontaneityPropertiesThrombus Aging +---------+---------------+---------+-----------+----------+--------------+ CFV      Full           Yes      Yes                                 +---------+---------------+---------+-----------+----------+--------------+ SFJ      Full                                                        +---------+---------------+---------+-----------+----------+--------------+ FV Prox  Full           Yes      Yes                                 +---------+---------------+---------+-----------+----------+--------------+ FV Mid   Full           Yes      Yes                                 +---------+---------------+---------+-----------+----------+--------------+ FV DistalFull  Yes      Yes                                  +---------+---------------+---------+-----------+----------+--------------+ PFV      Full                                                        +---------+---------------+---------+-----------+----------+--------------+ POP      Full           Yes      Yes                                 +---------+---------------+---------+-----------+----------+--------------+ PTV      Full                                                        +---------+---------------+---------+-----------+----------+--------------+ PERO     Full                                                        +---------+---------------+---------+-----------+----------+--------------+     Summary: BILATERAL: - No evidence of deep vein thrombosis seen in the lower extremities, bilaterally. - No evidence of superficial venous thrombosis in the lower extremities, bilaterally. -No evidence of popliteal cyst, bilaterally.   *See table(s) above for measurements and observations. Electronically signed by Heath Lark on 10/31/2020 at 5:15:12 PM.    Final     Labs: BNP (last 3 results) Recent Labs    10/30/20 2129  BNP 1,201.8*   Basic Metabolic Panel: Recent Labs  Lab 10/30/20 2129 10/30/20 2255 10/31/20 1340 11/01/20 0429 11/02/20 0447  NA 138  --  137 137 140  K 5.3*  --  4.9 4.7 4.5  CL 108  --  104 106 105  CO2 23  --  26 27 30   GLUCOSE 195*  --  260* 141* 181*  BUN 20  --  22* 28* 30*  CREATININE 1.76*  --  1.66*  1.74* 1.99* 1.88*  CALCIUM 8.8*  --  8.9 8.5* 8.8*  MG  --  1.6*  --  2.0 1.9   Liver Function Tests: Recent Labs  Lab 10/30/20 2129  AST 14*  ALT 12  ALKPHOS 71  BILITOT 0.4  PROT 6.1*  ALBUMIN 2.9*   Recent Labs  Lab 10/30/20 2129  LIPASE 25   No results for input(s): AMMONIA in the last 168 hours. CBC: Recent Labs  Lab 10/30/20 2129 10/31/20 1340 11/01/20 0429  WBC 8.3 7.6 6.4  NEUTROABS 6.2  --   --   HGB 11.7* 12.3* 11.4*  HCT 34.9* 38.5* 35.2*  MCV 96.4  100.3* 99.4  PLT 211 200 197   Cardiac Enzymes: No results for input(s): CKTOTAL, CKMB, CKMBINDEX, TROPONINI in the last 168 hours. BNP: Invalid input(s): POCBNP CBG: Recent Labs  Lab 11/01/20 1116 11/01/20 1606 11/01/20 1653 11/01/20  2206 11/02/20 0718  GLUCAP 183* 51* 134* 145* 191*   D-Dimer Recent Labs    10/31/20 1340  DDIMER 1.09*   Hgb A1c Recent Labs    10/31/20 1340  HGBA1C 6.8*   Lipid Profile Recent Labs    11/02/20 0447  CHOL 168  HDL 59  LDLCALC 83  TRIG 128  CHOLHDL 2.8   Thyroid function studies Recent Labs    10/31/20 1340  TSH 1.378   Anemia work up No results for input(s): VITAMINB12, FOLATE, FERRITIN, TIBC, IRON, RETICCTPCT in the last 72 hours. Urinalysis    Component Value Date/Time   COLORURINE YELLOW 07/16/2018 2223   APPEARANCEUR CLEAR 07/16/2018 2223   LABSPEC 1.012 07/16/2018 2223   PHURINE 5.0 07/16/2018 2223   GLUCOSEU 50 (A) 07/16/2018 2223   HGBUR MODERATE (A) 07/16/2018 2223   BILIRUBINUR NEGATIVE 07/16/2018 2223   KETONESUR NEGATIVE 07/16/2018 2223   PROTEINUR 100 (A) 07/16/2018 2223   UROBILINOGEN 1.0 12/09/2013 1937   NITRITE NEGATIVE 07/16/2018 2223   LEUKOCYTESUR TRACE (A) 07/16/2018 2223   Sepsis Labs Invalid input(s): PROCALCITONIN,  WBC,  LACTICIDVEN Microbiology Recent Results (from the past 240 hour(s))  Resp Panel by RT-PCR (Flu A&B, Covid) Nasopharyngeal Swab     Status: None   Collection Time: 10/30/20  9:29 PM   Specimen: Nasopharyngeal Swab; Nasopharyngeal(NP) swabs in vial transport medium  Result Value Ref Range Status   SARS Coronavirus 2 by RT PCR NEGATIVE NEGATIVE Final    Comment: (NOTE) SARS-CoV-2 target nucleic acids are NOT DETECTED.  The SARS-CoV-2 RNA is generally detectable in upper respiratory specimens during the acute phase of infection. The lowest concentration of SARS-CoV-2 viral copies this assay can detect is 138 copies/mL. A negative result does not preclude  SARS-Cov-2 infection and should not be used as the sole basis for treatment or other patient management decisions. A negative result may occur with  improper specimen collection/handling, submission of specimen other than nasopharyngeal swab, presence of viral mutation(s) within the areas targeted by this assay, and inadequate number of viral copies(<138 copies/mL). A negative result must be combined with clinical observations, patient history, and epidemiological information. The expected result is Negative.  Fact Sheet for Patients:  BloggerCourse.com  Fact Sheet for Healthcare Providers:  SeriousBroker.it  This test is no t yet approved or cleared by the [place] FDA and  has been authorized for detection and/or diagnosis of SARS-CoV-2 by FDA under an Emergency Use Authorization (EUA). This EUA will remain  in effect (meaning this test can be used) for the duration of the COVID-19 declaration under Section 564(b)(1) of the Act, 21 U.S.C.section 360bbb-3(b)(1), unless the authorization is terminated  or revoked sooner.       Influenza A by PCR NEGATIVE NEGATIVE Final   Influenza B by PCR NEGATIVE NEGATIVE Final    Comment: (NOTE) The Xpert Xpress SARS-CoV-2/FLU/RSV plus assay is intended as an aid in the diagnosis of influenza from Nasopharyngeal swab specimens and should not be used as a sole basis for treatment. Nasal washings and aspirates are unacceptable for Xpert Xpress SARS-CoV-2/FLU/RSV testing.  Fact Sheet for Patients: BloggerCourse.com  Fact Sheet for Healthcare Providers: SeriousBroker.it  This test is not yet approved or cleared by the [place] FDA and has been authorized for detection and/or diagnosis of SARS-CoV-2 by FDA under an Emergency Use Authorization (EUA). This EUA will remain in effect (meaning this test can be used) for the duration of  the COVID-19 declaration under Section 564(b)(1) of the  Act, 21 U.S.C. section 360bbb-3(b)(1), unless the authorization is terminated or revoked.  Performed at [facility], [address], [place], [place] [address]      Time coordinating discharge: 35 minutes  SIGNED: Lanae Boast, MD  [facility] 11/02/2020, 10:45 AM  If 7PM-7AM, please contact night-coverage [email or web address]

## 2020-11-02 NOTE — Progress Notes (Signed)
Progress Note  Patient Name: Eddie Foster Date of Encounter: 11/02/2020  [facility] Cardiologist: None   Subjective   Feels much better.  No shortness of breath, no chest pain.  He is eager to try to eat well.  Determine to stay away from influences he states.  Inpatient Medications    Scheduled Meds:  atorvastatin  20 mg Oral Daily   carvedilol  6.25 mg Oral BID WC   clopidogrel  75 mg Oral Daily   enoxaparin (LOVENOX) injection  40 mg Subcutaneous Q24H   folic acid  1 mg Oral Daily   furosemide  20 mg Intravenous Q12H   gabapentin  100 mg Oral TID   insulin aspart  0-9 Units Subcutaneous TID WC   insulin aspart protamine- aspart  15 Units Subcutaneous BID WC   multivitamin with minerals  1 tablet Oral Daily   sacubitril-valsartan  1 tablet Oral BID   thiamine  100 mg Oral Daily   Or   thiamine  100 mg Intravenous Daily   Continuous Infusions:  PRN Meds: acetaminophen **OR** acetaminophen, LORazepam **OR** LORazepam   Vital Signs    Vitals:   11/01/20 1251 11/01/20 2015 11/02/20 0500 11/02/20 0534  BP: (!) 152/96 (!) 145/88  (!) 154/88  Pulse: 62 65  63  Resp: 16 15  16   Temp: 98.1 F (36.7 C) 97.7 F (36.5 C)  97.6 F (36.4 C)  TempSrc: Oral Oral  Oral  SpO2: 100% 100%  100%  Weight:   84.1 kg   Height:        Intake/Output Summary (Last 24 hours) at 11/02/2020 0845 Last data filed at 11/02/2020 0538 Gross per 24 hour  Intake 840 ml  Output 1800 ml  Net -960 ml   Last 3 Weights 11/02/2020 11/01/2020 10/31/2020  Weight (lbs) 185 lb 8 oz 184 lb 9.6 oz 186 lb 6.4 oz  Weight (kg) 84.142 kg 83.734 kg 84.55 kg      Telemetry    Sinus rhythm no adverse arrhythmias- Personally Reviewed  ECG    10/30/2020-sinus rhythm LVH nonspecific ST-T wave changes- Personally Reviewed  Physical Exam   GEN: No acute distress.   Neck: No JVD Cardiac: RRR, no murmurs, rubs, or gallops.  Respiratory: Clear to auscultation bilaterally. GI: Soft, nontender,  non-distended  MS: No edema; No deformity. Neuro:  Nonfocal  Psych: Normal affect   Labs    Toxicology positive for cocaine  High Sensitivity Troponin:   Recent Labs  Lab 10/30/20 2129 10/30/20 2306  TROPONINIHS 47* 47*      Chemistry Recent Labs  Lab 10/30/20 2129 10/31/20 1340 11/01/20 0429 11/02/20 0447  NA 138 137 137 140  K 5.3* 4.9 4.7 4.5  CL 108 104 106 105  CO2 23 26 27 30   GLUCOSE 195* 260* 141* 181*  BUN 20 22* 28* 30*  CREATININE 1.76* 1.66*  1.74* 1.99* 1.88*  CALCIUM 8.8* 8.9 8.5* 8.8*  PROT 6.1*  --   --   --   ALBUMIN 2.9*  --   --   --   AST 14*  --   --   --   ALT 12  --   --   --   ALKPHOS 71  --   --   --   BILITOT 0.4  --   --   --   GFRNONAA 44* 47*  44* 38* 40*  ANIONGAP 7 7 4* 5     Hematology Recent Labs  Lab 10/30/20 2129 10/31/20 1340 11/01/20 0429  WBC 8.3 7.6 6.4  RBC 3.62* 3.84* 3.54*  HGB 11.7* 12.3* 11.4*  HCT 34.9* 38.5* 35.2*  MCV 96.4 100.3* 99.4  MCH 32.3 32.0 32.2  MCHC 33.5 31.9 32.4  RDW 13.8 14.0 13.7  PLT 211 200 197    BNP Recent Labs  Lab 10/30/20 2129  BNP 1,201.8*     DDimer  Recent Labs  Lab 10/31/20 1340  DDIMER 1.09*     Radiology    NM Pulmonary Perfusion  Result Date: 10/31/2020 CLINICAL DATA:  Chest pain and shortness of breath EXAM: NUCLEAR MEDICINE PERFUSION LUNG SCAN TECHNIQUE: Perfusion images were obtained in multiple projections after intravenous injection of radiopharmaceutical. Ventilation scans intentionally deferred if perfusion scan and chest x-ray adequate for interpretation during COVID 19 epidemic. RADIOPHARMACEUTICALS:  4.3 mCi Tc-48m MAA IV COMPARISON:  Chest x-ray from the previous day. FINDINGS: There is adequate uptake of radioactive tracer throughout both lungs on perfusion images. No wedge-shaped defects are identified to suggest pulmonary embolism. IMPRESSION: No evidence of pulmonary embolism. Electronically Signed   By: Alcide Clever M.D.   On: 10/31/2020 19:52    ECHOCARDIOGRAM COMPLETE  Result Date: 10/31/2020    ECHOCARDIOGRAM REPORT   Patient Name:   Eddie Foster Date of Exam: 10/31/2020 Medical Rec #:  [id number]         Height:       69.0 in Accession #:    [id number]        Weight:       186.4 lb Date of Birth:  21-Jan-1961         BSA:          2.005 m Patient Age:    59 years          BP:           157/101 mmHg Patient Gender: M                 HR:           63 bpm. Exam Location:  Inpatient Procedure: 2D Echo, 3D Echo, Cardiac Doppler and Color Doppler Indications:    I50.40* Unspecified combined systolic (congestive) and diastolic                 (congestive) heart failure  History:        Patient has no prior history of Echocardiogram examinations.                 CHF, CAD; Risk Factors:Diabetes.  Sonographer:    Sheralyn Boatman RDCS Referring Phys: [id number] Meryle Ready [facility] IMPRESSIONS  1. Left ventricular ejection fraction, by estimation, is 25%. The left ventricle has severely decreased function. The left ventricle demonstrates global hypokinesis. The left ventricular internal cavity size was mildly dilated. There is mild left ventricular hypertrophy. Left ventricular diastolic parameters are consistent with Grade II diastolic dysfunction (pseudonormalization).  2. Right ventricular systolic function is mildly reduced. The right ventricular size is normal. There is moderately elevated pulmonary artery systolic pressure. The estimated right ventricular systolic pressure is 50.0 mmHg.  3. Left atrial size was moderately dilated.  4. Right atrial size was mildly dilated.  5. The mitral valve is normal in structure. Trivial mitral valve regurgitation. No evidence of mitral stenosis.  6. The aortic valve is tricuspid. Aortic valve regurgitation is not visualized. Mild aortic valve sclerosis is present, with no evidence of aortic valve stenosis.  7. Aortic dilatation noted. There is  mild dilatation of the ascending aorta, measuring 40 mm.  8. The inferior vena cava  is dilated in size with >50% respiratory variability, suggesting right atrial pressure of 8 mmHg. FINDINGS  Left Ventricle: Left ventricular ejection fraction, by estimation, is 25%. The left ventricle has severely decreased function. The left ventricle demonstrates global hypokinesis. The left ventricular internal cavity size was mildly dilated. There is mild left ventricular hypertrophy. Left ventricular diastolic parameters are consistent with Grade II diastolic dysfunction (pseudonormalization). Right Ventricle: The right ventricular size is normal. No increase in right ventricular wall thickness. Right ventricular systolic function is mildly reduced. There is moderately elevated pulmonary artery systolic pressure. The tricuspid regurgitant velocity is 3.24 m/s, and with an assumed right atrial pressure of 8 mmHg, the estimated right ventricular systolic pressure is 50.0 mmHg. Left Atrium: Left atrial size was moderately dilated. Right Atrium: Right atrial size was mildly dilated. Pericardium: Trivial pericardial effusion is present. Mitral Valve: The mitral valve is normal in structure. Mild mitral annular calcification. Trivial mitral valve regurgitation. No evidence of mitral valve stenosis. Tricuspid Valve: The tricuspid valve is normal in structure. Tricuspid valve regurgitation is trivial. Aortic Valve: The aortic valve is tricuspid. Aortic valve regurgitation is not visualized. Mild aortic valve sclerosis is present, with no evidence of aortic valve stenosis. Pulmonic Valve: The pulmonic valve was normal in structure. Pulmonic valve regurgitation is not visualized. Aorta: The aortic root is normal in size and structure and aortic dilatation noted. There is mild dilatation of the ascending aorta, measuring 40 mm. Venous: The inferior vena cava is dilated in size with greater than 50% respiratory variability, suggesting right atrial pressure of 8 mmHg. IAS/Shunts: No atrial level shunt detected by color  flow Doppler.  LEFT VENTRICLE PLAX 2D LVIDd:         5.70 cm      Diastology LVIDs:         5.10 cm      LV e' medial:    4.79 cm/s LV PW:         1.60 cm      LV E/e' medial:  20.9 LV IVS:        1.50 cm      LV e' lateral:   5.05 cm/s LVOT diam:     2.30 cm      LV E/e' lateral: 19.8 LV SV:         86 LV SV Index:   43 LVOT Area:     4.15 cm  LV Volumes (MOD) LV vol d, MOD A2C: 161.0 ml LV vol d, MOD A4C: 180.0 ml LV vol s, MOD A2C: 126.0 ml LV vol s, MOD A4C: 134.0 ml LV SV MOD A2C:     35.0 ml LV SV MOD A4C:     180.0 ml LV SV MOD BP:      44.2 ml RIGHT VENTRICLE             IVC RV S prime:     12.60 cm/s  IVC diam: 2.40 cm TAPSE (M-mode): 1.7 cm LEFT ATRIUM           Index       RIGHT ATRIUM           Index LA diam:      4.60 cm 2.29 cm/m  RA Area:     18.50 cm LA Vol (A2C): 83.9 ml 41.84 ml/m RA Volume:   56.60 ml  28.23 ml/m LA Vol (A4C): 52.1 ml 25.98  ml/m  AORTIC VALVE LVOT Vmax:   101.00 cm/s LVOT Vmean:  66.100 cm/s LVOT VTI:    0.206 m  AORTA Ao Root diam: 3.40 cm Ao Asc diam:  4.00 cm MITRAL VALVE                TRICUSPID VALVE MV Area (PHT): 3.61 cm     TR Peak grad:   42.0 mmHg MV Decel Time: 210 msec     TR Vmax:        324.00 cm/s MV E velocity: 100.00 cm/s MV A velocity: 34.30 cm/s   SHUNTS MV E/A ratio:  2.92         Systemic VTI:  0.21 m                             Systemic Diam: 2.30 cm Marca Ancona MD Electronically signed by Marca Ancona MD Signature Date/Time: 10/31/2020/5:20:10 PM    Final    VAS [place] LOWER EXTREMITY VENOUS (DVT)  Result Date: 10/31/2020  Lower Venous DVT Study Patient Name:  DIMA FERRUFINO Guadamuz  Date of Exam:   10/31/2020 Medical Rec #: [id number]          Accession #:    [id number] Date of Birth: 1960-10-08          Patient Gender: M Patient Age:   59Y Exam Location:  [facility] Procedure:      VAS [place] LOWER EXTREMITY VENOUS (DVT) Referring Phys: [id number] Eduard Clos --------------------------------------------------------------------------------   Indications: SOB. Other Indications: Acute CHF & CKD3 with BLE edema. Comparison Study: No previous exams Performing Technologist: Jody Hill RVT, RDMS  Examination Guidelines: A complete evaluation includes B-mode imaging, spectral Doppler, color Doppler, and power Doppler as needed of all accessible portions of each vessel. Bilateral testing is considered an integral part of a complete examination. Limited examinations for reoccurring indications may be performed as noted. The reflux portion of the exam is performed with the patient in reverse Trendelenburg.  +---------+---------------+---------+-----------+----------+--------------+ RIGHT    CompressibilityPhasicitySpontaneityPropertiesThrombus Aging +---------+---------------+---------+-----------+----------+--------------+ CFV      Full           Yes      Yes                                 +---------+---------------+---------+-----------+----------+--------------+ SFJ      Full                                                        +---------+---------------+---------+-----------+----------+--------------+ FV Prox  Full           Yes      Yes                                 +---------+---------------+---------+-----------+----------+--------------+ FV Mid   Full           Yes      Yes                                 +---------+---------------+---------+-----------+----------+--------------+ FV DistalFull  Yes      Yes                                 +---------+---------------+---------+-----------+----------+--------------+ PFV      Full                                                        +---------+---------------+---------+-----------+----------+--------------+ POP      Full           Yes      Yes                                 +---------+---------------+---------+-----------+----------+--------------+ PTV      Full                                                         +---------+---------------+---------+-----------+----------+--------------+ PERO     Full                                                        +---------+---------------+---------+-----------+----------+--------------+   +---------+---------------+---------+-----------+----------+--------------+ LEFT     CompressibilityPhasicitySpontaneityPropertiesThrombus Aging +---------+---------------+---------+-----------+----------+--------------+ CFV      Full           Yes      Yes                                 +---------+---------------+---------+-----------+----------+--------------+ SFJ      Full                                                        +---------+---------------+---------+-----------+----------+--------------+ FV Prox  Full           Yes      Yes                                 +---------+---------------+---------+-----------+----------+--------------+ FV Mid   Full           Yes      Yes                                 +---------+---------------+---------+-----------+----------+--------------+ FV DistalFull           Yes      Yes                                 +---------+---------------+---------+-----------+----------+--------------+ PFV      Full                                                        +---------+---------------+---------+-----------+----------+--------------+  POP      Full           Yes      Yes                                 +---------+---------------+---------+-----------+----------+--------------+ PTV      Full                                                        +---------+---------------+---------+-----------+----------+--------------+ PERO     Full                                                        +---------+---------------+---------+-----------+----------+--------------+     Summary: BILATERAL: - No evidence of deep vein thrombosis seen in the lower extremities, bilaterally. - No evidence of  superficial venous thrombosis in the lower extremities, bilaterally. -No evidence of popliteal cyst, bilaterally.   *See table(s) above for measurements and observations. Electronically signed by Heath Larkhomas Hawken on 10/31/2020 at 5:15:12 PM.    Final     Cardiac Studies   Echocardiogram this admit: - EF 25%, pulmonary pressures 50 mmHg, moderately dilated left atrium, mildly dilated ascending aorta 40 mm.  Lower extremity Dopplers negative for DVT this admit.  Patient Profile     60 y.o. 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  PCI to LAD in 2019.  Ongoing alcohol and cocaine use, chronic kidney disease stage IIIb.  Assessment & Plan    Acute on chronic systolic heart failure - He transitioned to Entresto from lisinopril. - No spironolactone given hyperkalemia on admission. - Not a candidate for ICD as long as cocaine use is present. - Continuing with carvedilol 6.25 mg twice a day.  No evidence of vasospasm from cocaine use.  Alpha-blocker present. -On Lasix 20 mg IV every 12. OK to change to lasix 20mg  PO QD since he is on Entresto.  -Could consider Comor[place]Farxiga or Jardiance in the future as well.  Coronary artery disease - Prior PCI to LAD with minimal RCA and circumflex disease. - Recent nuclear stress test April 2021 showed EF of 26%. -Mildly elevated flat troponin compatible with demand ischemia in the setting of heart failure.  Not ACS.  On follow-up, Dr. Judithe [facility]Folk [facility]. Increase Entresto as outpatient.  OK with DC    For questions or updates, please contact [facility] Please consult [email or web address] for contact info under        Signed, Donato SchultzMark Darletta Noblett, MD  11/02/2020, 8:45 AM

## 2021-04-06 IMAGING — CT CT HEAD WITHOUT CONTRAST
3 series · 15 of 47 positions shown, 18 images · non-contrast
Comparison: Head CT scan 09/21/2009 and 07/16/2018.

CLINICAL DATA: Acute onset dizziness when standing today.

EXAM:
CT HEAD WITHOUT CONTRAST
TECHNIQUE: Contiguous axial images were obtained from the base of the skull
through the vertex without intravenous contrast.

[Series 2: head wo · axial · 0.44mm/px · z∈[-155,-20]mm · 9 of 33 slices shown, 12 images]
[im 3/33  brain]
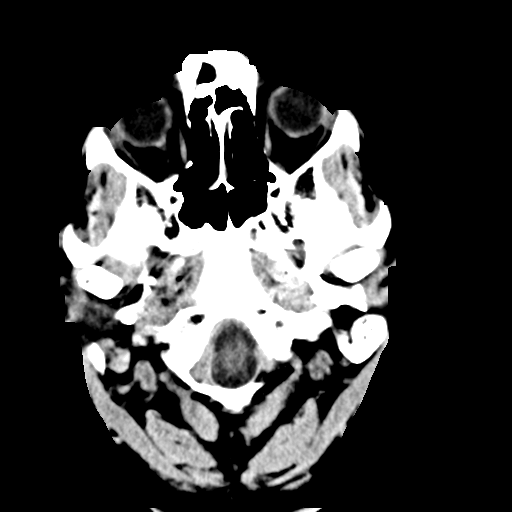
[im 3/33  bone]
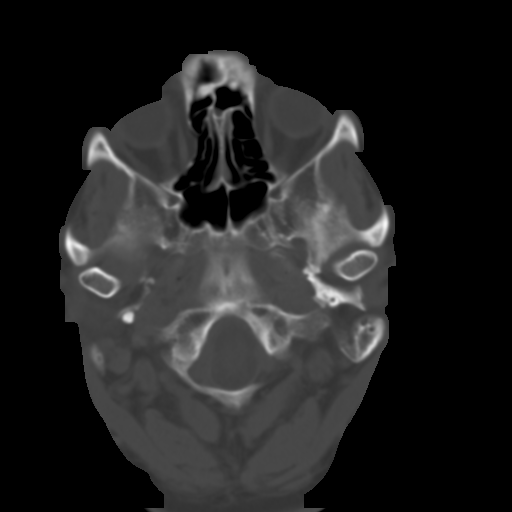
[im 6/33  brain]
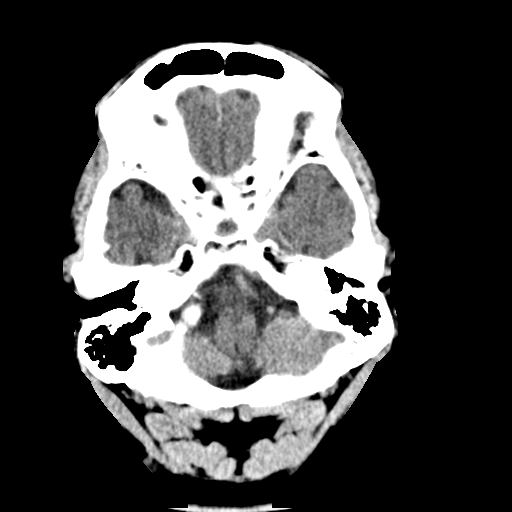
[im 9/33  brain]
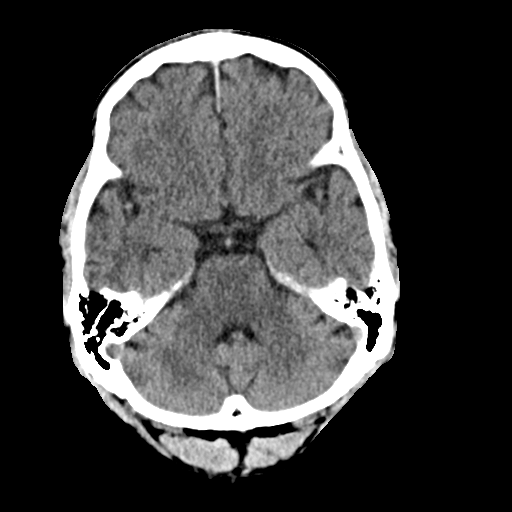
[im 13/33  brain]
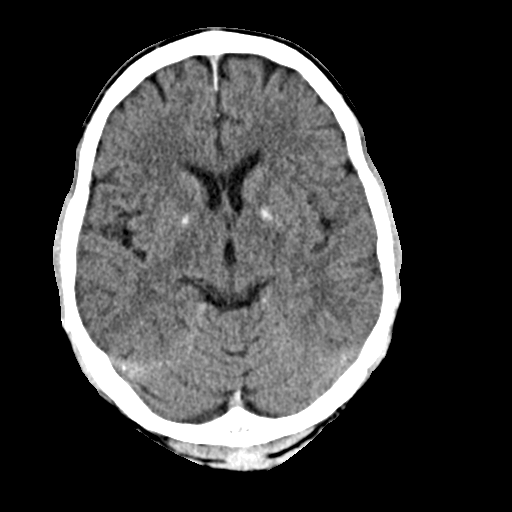
[im 17/33  brain]
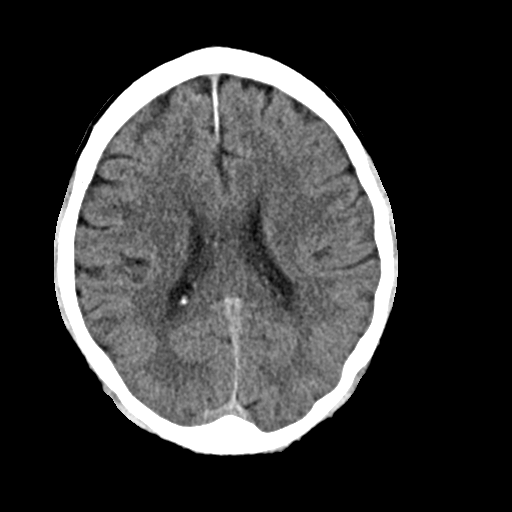
[im 17/33  bone]
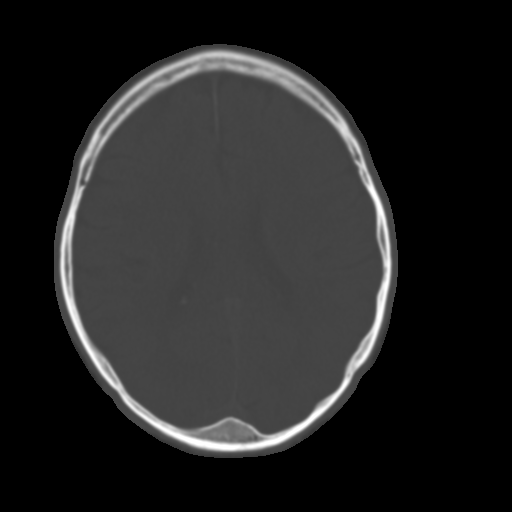
[im 20/33  brain]
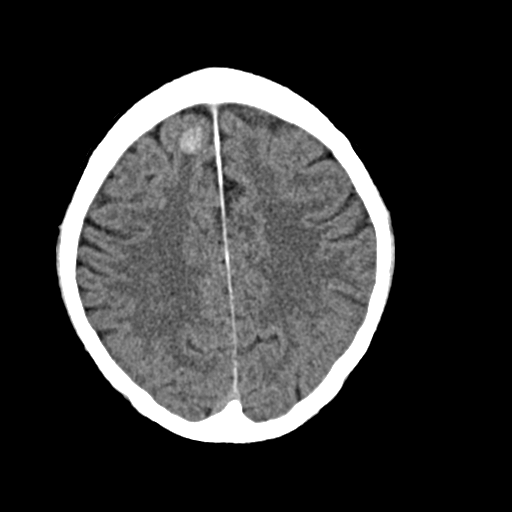
[im 24/33  brain]
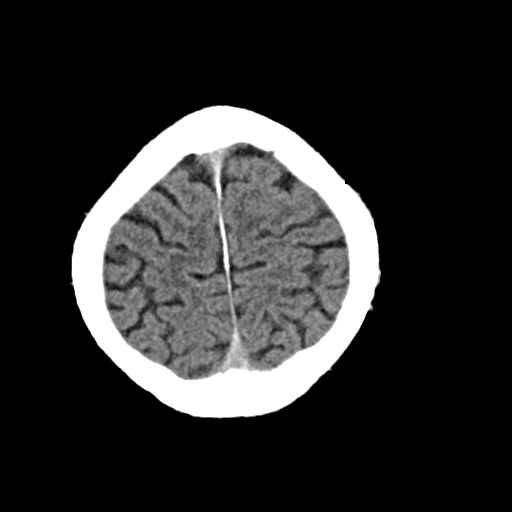
[im 27/33  brain]
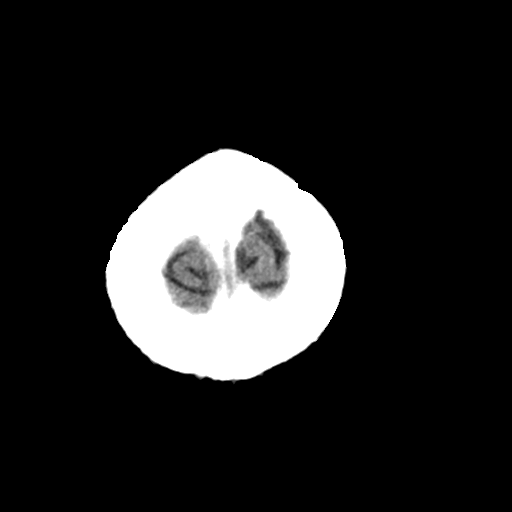
[im 30/33  brain]
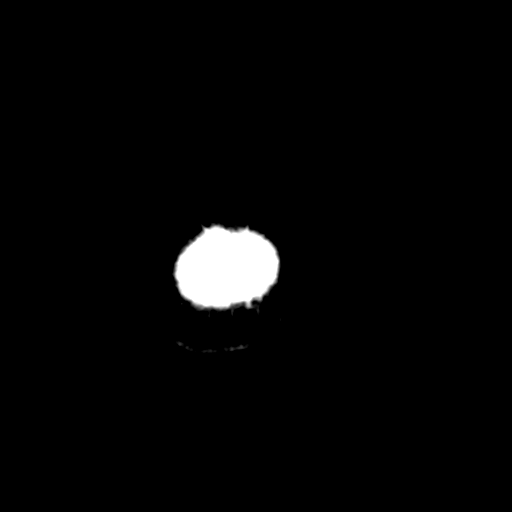
[im 30/33  bone]
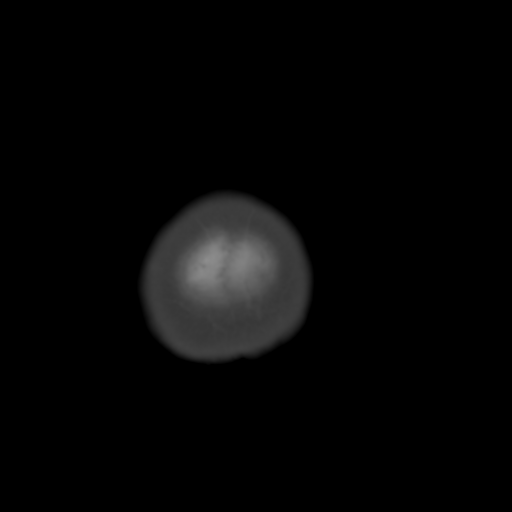

[Series 4: coronal soft · coronal · 0.31mm/px · 3 of 68 slices shown]
[im 23/68  brain]
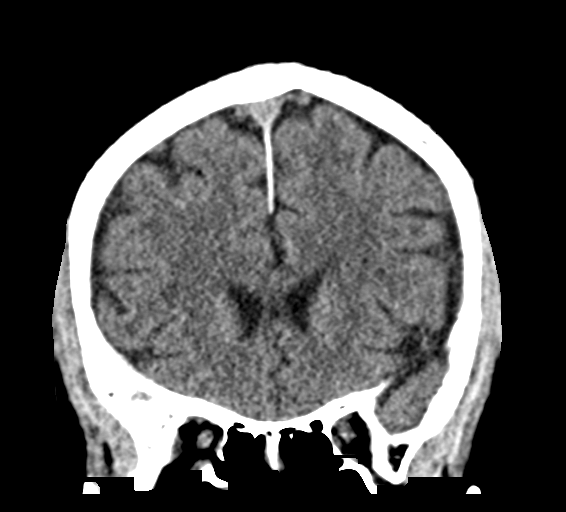
[im 30/68  brain]
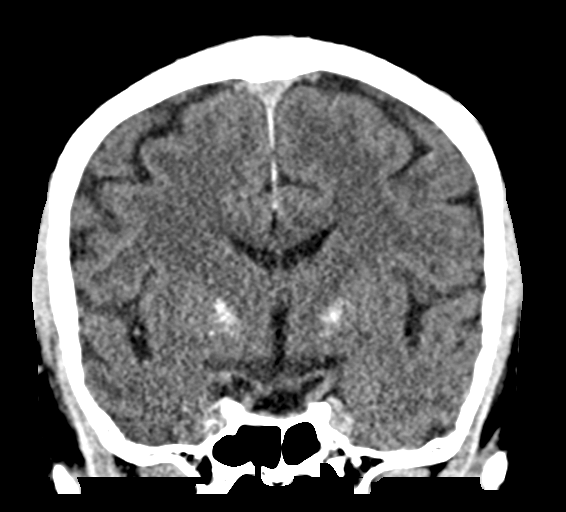
[im 38/68  brain]
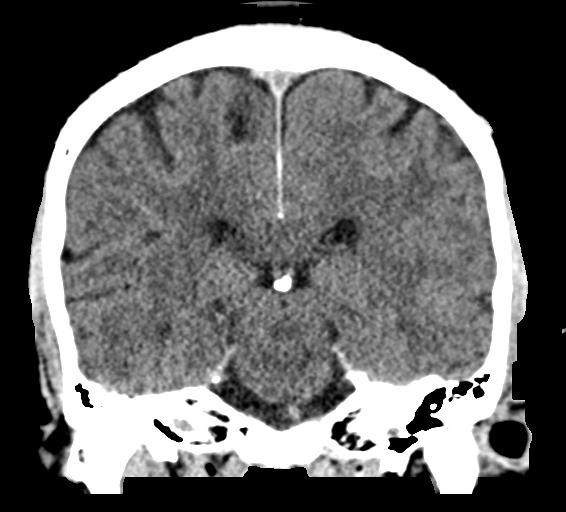

[Series 5: sag soft · sagittal · 0.33mm/px · 3 of 56 slices shown]
[im 19/56  brain]
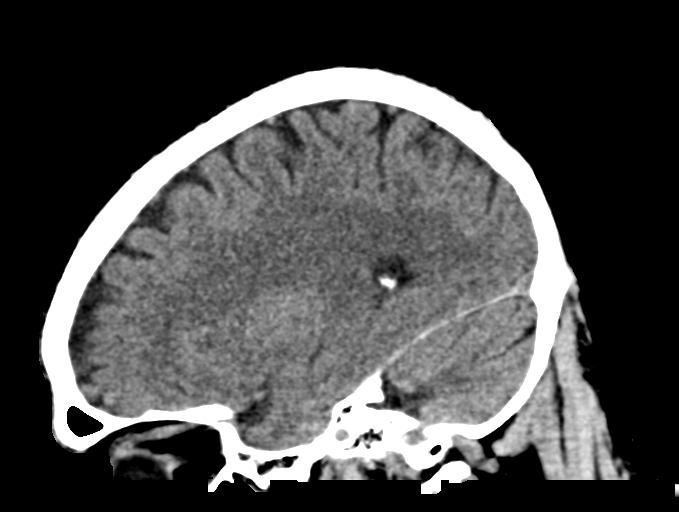
[im 28/56  brain]
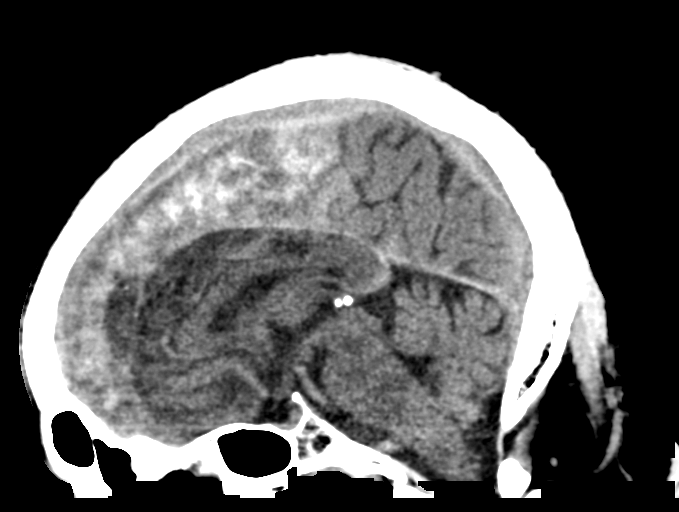
[im 37/56  brain]
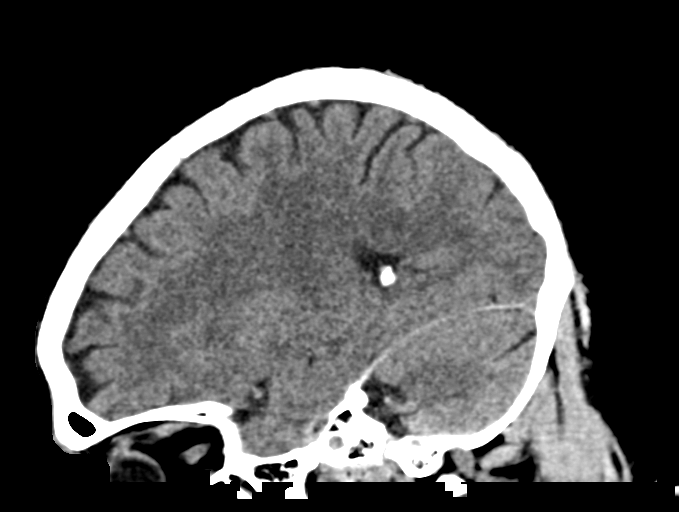

[15 of 47 positions shown; findings below may reference images not displayed]

FINDINGS: Brain: Focal round hemorrhage is seen in the right frontal lobe
measuring approximately 1.3 cm in diameter the hemorrhage is less
dense than seen on the prior examination. Second smaller hemorrhage
present on the prior study is no longer visible. No new hemorrhage
or other new intracranial abnormality is identified.

Vascular: No hyperdense vessel or unexpected calcification.

Skull: Intact.  No focal lesion.

Sinuses/Orbits: No acute finding. Remote fracture medial wall of the
right orbit is unchanged. No focal bony lesion.

Other: None.
IMPRESSION: Resolving intraparenchymal hemorrhage in the right frontal lobe. No
new abnormality since the prior exam.

## 2021-12-29 ENCOUNTER — Emergency Department (HOSPITAL_BASED_OUTPATIENT_CLINIC_OR_DEPARTMENT_OTHER)
Admission: EM | Admit: 2021-12-29 | Discharge: 2021-12-29 | Discharge status: Against Medical Advice | Payer: Medicaid Other | Attending: Emergency Medicine | Admitting: Emergency Medicine

## 2021-12-29 ENCOUNTER — Other Ambulatory Visit: Payer: Self-pay

## 2021-12-29 ENCOUNTER — Emergency Department (HOSPITAL_BASED_OUTPATIENT_CLINIC_OR_DEPARTMENT_OTHER): Payer: Medicaid Other

## 2021-12-29 DIAGNOSIS — N179 Acute kidney failure, unspecified: Secondary | ICD-10-CM | POA: Diagnosis not present

## 2021-12-29 DIAGNOSIS — N3001 Acute cystitis with hematuria: Secondary | ICD-10-CM

## 2021-12-29 DIAGNOSIS — R739 Hyperglycemia, unspecified: Secondary | ICD-10-CM | POA: Diagnosis not present

## 2021-12-29 DIAGNOSIS — E875 Hyperkalemia: Secondary | ICD-10-CM | POA: Diagnosis not present

## 2021-12-29 DIAGNOSIS — I509 Heart failure, unspecified: Secondary | ICD-10-CM

## 2021-12-29 DIAGNOSIS — N390 Urinary tract infection, site not specified: Secondary | ICD-10-CM | POA: Insufficient documentation

## 2021-12-29 DIAGNOSIS — Z20822 Contact with and (suspected) exposure to covid-19: Secondary | ICD-10-CM | POA: Insufficient documentation

## 2021-12-29 DIAGNOSIS — I5020 Unspecified systolic (congestive) heart failure: Secondary | ICD-10-CM | POA: Diagnosis not present

## 2021-12-29 LAB — BASIC METABOLIC PANEL
Anion gap: 9 (ref 5–15)
BUN: 48 mg/dL — ABNORMAL HIGH (ref 8–23)
CO2: 20 mmol/L — ABNORMAL LOW (ref 22–32)
Calcium: 8.2 mg/dL — ABNORMAL LOW (ref 8.9–10.3)
Chloride: 103 mmol/L (ref 98–111)
Creatinine, Ser: 3.75 mg/dL — ABNORMAL HIGH (ref 0.61–1.24)
GFR, Estimated: 18 mL/min — ABNORMAL LOW (ref >60)
Glucose, Bld: 287 mg/dL — ABNORMAL HIGH (ref 70–99)
Potassium: 5.7 mmol/L — ABNORMAL HIGH (ref 3.5–5.1)
Sodium: 132 mmol/L — ABNORMAL LOW (ref 135–145)

## 2021-12-29 LAB — CBC
HCT: 32.2 % — ABNORMAL LOW (ref 39.0–52.0)
Hemoglobin: 10.8 g/dL — ABNORMAL LOW (ref 13.0–17.0)
MCH: 31.4 pg (ref 26.0–34.0)
MCHC: 33.5 g/dL (ref 30.0–36.0)
MCV: 93.6 fL (ref 80.0–100.0)
Platelets: 130 10*3/uL — ABNORMAL LOW (ref 150–400)
RBC: 3.44 MIL/uL — ABNORMAL LOW (ref 4.22–5.81)
RDW: 13.3 % (ref 11.5–15.5)
WBC: 19.4 10*3/uL — ABNORMAL HIGH (ref 4.0–10.5)
nRBC: 0 % (ref 0.0–0.2)

## 2021-12-29 LAB — URINALYSIS, MICROSCOPIC (REFLEX): WBC, UA: >50 WBC/hpf (ref 0–5)

## 2021-12-29 LAB — LACTIC ACID, PLASMA
Lactic Acid, Venous: 2.1 mmol/L (ref 0.5–1.9)
Lactic Acid, Venous: 1.7 mmol/L (ref 0.5–1.9)

## 2021-12-29 LAB — URINALYSIS, ROUTINE W REFLEX MICROSCOPIC
Bilirubin Urine: NEGATIVE
Glucose, UA: >=500 mg/dL — AB
Ketones, ur: NEGATIVE mg/dL
Nitrite: NEGATIVE
Protein, ur: >=300 mg/dL — AB
Specific Gravity, Urine: 1.025 (ref 1.005–1.030)
pH: 5 (ref 5.0–8.0)

## 2021-12-29 LAB — SARS CORONAVIRUS 2 BY RT PCR: SARS Coronavirus 2 by RT PCR: NEGATIVE

## 2021-12-29 LAB — BRAIN NATRIURETIC PEPTIDE: B Natriuretic Peptide: 3921.8 pg/mL — ABNORMAL HIGH (ref 0.0–100.0)

## 2021-12-29 LAB — CBG MONITORING, ED
Glucose-Capillary: 270 mg/dL — ABNORMAL HIGH (ref 70–99)
Glucose-Capillary: 351 mg/dL — ABNORMAL HIGH (ref 70–99)

## 2021-12-29 MED ORDER — SODIUM CHLORIDE 0.9 % IV BOLUS
1000.0000 mL | Freq: Once | INTRAVENOUS | Status: AC
  Administered 2021-12-29: 1000 mL via INTRAVENOUS

## 2021-12-29 MED ORDER — INSULIN ASPART 100 UNIT/ML IJ SOLN
3.0000 [IU] | Freq: Once | INTRAMUSCULAR | Status: AC
  Administered 2021-12-29: 3 [IU] via SUBCUTANEOUS

## 2021-12-29 MED ORDER — SODIUM CHLORIDE 0.9 % IV SOLN
2.0000 g | Freq: Once | INTRAVENOUS | Status: AC
  Administered 2021-12-29: 2 g via INTRAVENOUS
  Filled 2021-12-29: qty 20

## 2021-12-29 MED ORDER — CEPHALEXIN 500 MG PO CAPS: 500.0000 mg | ORAL_CAPSULE | Freq: Three times a day (TID) | ORAL | 0 refills | Status: AC

## 2021-12-29 NOTE — ED Notes (Signed)
Bladder scan showed 105 mL, 106 mL, and 115 mL.

## 2021-12-29 NOTE — ED Notes (Signed)
Bladder scanner ranged 33-63mLs x 3x

## 2021-12-29 NOTE — ED Provider Notes (Signed)
Care received in transfer from [place], PA-C at shift change.  In summation 61 year old history of extensive urology procedures as well as heart failure here for evaluation of rigors, hyperglycemia as well as difficulty urinating.  Recent procedure at [facility] approximately 10 days ago.  Appears to have UTI.  He is not currently retaining urine.  He has normal mental status.  Did have elevated lactic acid as well as white count concerning for early sepsis.  He was given IV fluids as well as antibiotics.  He has some lower extremity swelling however chest x-ray negative for acute pulmonary edema. Physical Exam  BP 138/89   Pulse 77   Temp 97.8 F (36.6 C) (Oral)   Resp 18   Ht 5\' 9"  (1.753 m)   Wt 81.6 kg   SpO2 99%   BMI 26.58 kg/m   Physical Exam Vitals and nursing note reviewed.  Constitutional:      General: He is not in acute distress.    Appearance: He is well-developed. He is not ill-appearing or diaphoretic.  HENT:     Head: Atraumatic.  Eyes:     Pupils: Pupils are equal, round, and reactive to light.  Cardiovascular:     Rate and Rhythm: Normal rate and regular rhythm.  Pulmonary:     Effort: Pulmonary effort is normal. No respiratory distress.  Abdominal:     General: There is no distension.     Palpations: Abdomen is soft.  Musculoskeletal:        General: Normal range of motion.     Cervical back: Normal range of motion and neck supple.     Comments: BLE pitting edema  Skin:    General: Skin is warm and dry.  Neurological:     General: No focal deficit present.     Mental Status: He is alert and oriented to person, place, and time.     Procedures  Procedures Labs Reviewed  BASIC METABOLIC PANEL - Abnormal; Notable for the following components:      Result Value   Sodium 132 (*)    Potassium 5.7 (*)    CO2 20 (*)    Glucose, Bld 287 (*)    BUN 48 (*)    Creatinine, Ser 3.75 (*)    Calcium 8.2 (*)    GFR, Estimated 18 (*)    All other  components within normal limits  CBC - Abnormal; Notable for the following components:   WBC 19.4 (*)    RBC 3.44 (*)    Hemoglobin 10.8 (*)    HCT 32.2 (*)    Platelets 130 (*)    All other components within normal limits  URINALYSIS, ROUTINE W REFLEX MICROSCOPIC - Abnormal; Notable for the following components:   APPearance HAZY (*)    Glucose, UA >=500 (*)    Hgb urine dipstick MODERATE (*)    Protein, ur >=300 (*)    Leukocytes,Ua TRACE (*)    All other components within normal limits  LACTIC ACID, PLASMA - Abnormal; Notable for the following components:   Lactic Acid, Venous 2.1 (*)    All other components within normal limits  URINALYSIS, MICROSCOPIC (REFLEX) - Abnormal; Notable for the following components:   Bacteria, UA MANY (*)    All other components within normal limits  BRAIN NATRIURETIC PEPTIDE - Abnormal; Notable for the following components:   B Natriuretic Peptide 3,921.8 (*)    All other components within normal limits  CBG MONITORING, ED - Abnormal;  Notable for the following components:   Glucose-Capillary 270 (*)    All other components within normal limits  CBG MONITORING, ED - Abnormal; Notable for the following components:   Glucose-Capillary 351 (*)    All other components within normal limits  SARS CORONAVIRUS 2 BY RT PCR  CULTURE, BLOOD (ROUTINE X 2)  CULTURE, BLOOD (ROUTINE X 2)  URINE CULTURE  LACTIC ACID, PLASMA   CT Renal Stone Study  Result Date: 12/29/2021 CLINICAL DATA:  Bladder neck obstruction EXAM: CT ABDOMEN AND PELVIS WITHOUT CONTRAST TECHNIQUE: Multidetector CT imaging of the abdomen and pelvis was performed following the standard protocol without IV contrast. RADIATION DOSE REDUCTION: This exam was performed according to the departmental dose-optimization program which includes automated exposure control, adjustment of the mA and/or kV according to patient size and/or use of iterative reconstruction technique. COMPARISON:  CT 12/10/2021  FINDINGS: Lower chest: Cardiomegaly. Small pericardial effusion. No acute abnormality. Hepatobiliary: No focal liver abnormality is seen. Cholelithiasis without evidence of cholecystitis. Pancreas: Grossly unremarkable. Spleen: Normal in size without focal abnormality. Adrenals/Urinary Tract: Adrenal glands are grossly unremarkable. No urolithiasis or hydronephrosis. Bladder poorly distended and is thick-walled. Stomach/Bowel: Stomach is within normal limits. Appendix appears normal. Normal caliber large and small bowel. Vascular/Lymphatic: Aortic atherosclerosis. No enlarged abdominal or pelvic lymph nodes. Reproductive: Enlarged prostate. Other: Mesenteric edema.  Body wall anasarca. Musculoskeletal: No fracture is seen. IMPRESSION: The bladder is thick walled. This at least in part is due to poor distention however obstructive uropathy in the setting of an enlarged prostate could appear similarly. Cholelithiasis without cholecystitis. Mesenteric and body wall edema. Cardiomegaly. Small pericardial effusion. Aortic Atherosclerosis (ICD10-I70.0). Electronically Signed   By: Minerva Fester M.D.   On: 12/29/2021 20:15   DG Chest 2 View  Result Date: 12/29/2021 CLINICAL DATA:  Cough.  Decreased appetite.  Generalized weakness. EXAM: CHEST - 2 VIEW COMPARISON:  AP chest 09/17/2021 FINDINGS: Cardiac silhouette is again mildly enlarged. Moderate calcification within the aortic arch. The lungs are clear. No pleural effusion or pneumothorax. Mild multilevel degenerative disc changes of the thoracic spine. IMPRESSION: Unchanged mild cardiomegaly.  No acute lung process. Electronically Signed   By: Neita Garnet M.D.   On: 12/29/2021 13:55    ED Course / MDM   Clinical Course as of 12/29/21 2229  Mon Dec 29, 2021  1652 WBC(!): 19.4 [AH]  1652 Glucose-Capillary(!): 351 [AH]  1653 DG Chest 2 View Visualized and interpreted 2 view chest x-ray shows some cardiomegaly [AH]  1653 Creatinine(!): 3.75 Patient's  creatinine noted to be 3.75 today.  It was 2.7 on 9 1 this represents acute kidney injury. [AH]  1813 Bladder scan reviewed only 105 in the bladder.  I have ordered fluids considering patient's elevated thick acid and AKI. [AH]  1942 Staff Bladder Scanned patient prior to urination which showed 155cc in bladder however patient voided greater than 300 cc after scan? [BH]  1951 Post void residual 30 cc [BH]  2023 CT stone shows thickened bladder wall [BH]    Clinical Course User Index [AH] Arthor Captain, PA-C [BH] Marshell Dilauro A, PA-C   Personally interpreted patient's labs and imaging CT Stone of bladder wall thickening Chest x-ray without edema CBC leukocytosis 19.4 Metabolic panel potassium 5.7, creatinine 3.75 up from baseline at 2.7 COVID-negative UA positive for infection BNP 3921  Gust results with patient, wife in room.  They are agreeable for admission.  Prefer [place] [facility]  Will discuss with hospitalist.  Discussed with PALS  line at [facility].  They will consult hospitalist to determine if bed placement available   Patient asked me to coming back into room.  States he is not staying the night in the hospital.  I discussed his current situation with renal failure as well as fluid overload as well as concern for early sepsis given his white count and fevers at home.  Patient and wife voiced understanding.  Patient is leaving against medical advice.  He has no altered mental status and has capacity make medical decisions.  Patient will be leave [place]  Did write for antibiotics for his UTI.  States he will follow-up outpatient.  I urged him to return to nearest emergency department if he changes his mind about admission or if his condition worsens.   We discussed the nature and purpose, risks and benefits, as well as, the alternatives of treatment. Time was given to allow the opportunity to ask questions and consider their options, and after the discussion,  the patient decided to refuse the offerred treatment. The patient was informed that refusal could lead to, but was not limited to, death, permanent disability, or severe pain. If present, I asked the relatives or significant others to dissuade them without success. Prior to refusing, I determined that the patient had the capacity to make their decision and understood the consequences of that decision. After refusal, I made every reasonable opportunity to treat them to the best of my ability.  The patient was notified that they may return to the emergency department at any time for further treatment.        Medical Decision Making Amount and/or Complexity of Data Reviewed Independent Historian: spouse External Data Reviewed: labs, radiology, ECG and notes. Labs: ordered. Decision-making details documented in ED Course. Radiology: ordered. Decision-making details documented in ED Course. ECG/medicine tests: ordered and independent interpretation performed. Decision-making details documented in ED Course.  Risk OTC drugs. Prescription drug management. Parenteral controlled substances. Decision regarding hospitalization. Diagnosis or treatment significantly limited by social determinants of health.     UTI Hyperglycemia AKI Acute on chronic congestive heart failure Hyperkalemia     Diasia Henken A, PA-C 12/29/21 2229    Tegeler, Gwenyth Allegra, MD 12/29/21 2245

## 2021-12-29 NOTE — ED Notes (Signed)
Pt unable to give urine sample at this time , will try again later. 

## 2021-12-29 NOTE — Discharge Instructions (Addendum)
Leaving AGAINST MEDICAL ADVICE today.  Please make sure to have close follow-up with urologist.  As discussed in the room you need repeat of your labs as you have acute renal dysfunction as well as excess fluid.  Please to the nearest emergency department if your symptoms worsen or you change your mind about admission

## 2021-12-29 NOTE — ED Notes (Signed)
Bladder scan was .

## 2021-12-29 NOTE — ED Triage Notes (Signed)
Pt to ED c/o flu like symptoms x 2 days. Reports decreased appetite, cough, generalized weakness, congestion. Pt also reports hx DM and sugars have been running high. Reports sugar as high as 500 yesterday, reports did not check blood sugar today.

## 2021-12-29 NOTE — ED Provider Notes (Signed)
[facility] Provider Note   CSN: [id number] Arrival date & time: 12/29/21  1131     History  Chief Complaint  Patient presents with   URI   Hyperglycemia    Eddie Foster is a 61 y.o. male with a past medical history of diabetes, hypertension, stage III chronic kidney disease, CHF history of urethral stricture status post cystoscopy with retrograde pyelogram and complex Foley catheter appointment along with balloon dilation of bulbar stricture by Dr. Delphina Cahill on 12/19/2021. He states that he was doing well and felt well-healed up until several days ago.  He has been having increased swelling and has had taken more Lasix recently due to edema in his lower extremities.  Patient noted that every time he would try to urinate he would only have "a little squirt" of urine.  He denies any blood in his urine.  Patient is attended by his wife who helps supplement the history.  He reports that last night he was freezing.  He turned his heat up too high.  He got under multiple covers.  He states that he was almost too weak to walk to his bathroom and back which she did multiple times.  He had shaking chills.  Around 3:00 in the morning he called his wife.  His wife states that he was extremely disoriented and did not know where he was.  This morning she brought him and talk took him to the ER.  Denies shortness of breath, he has no significant urinary symptoms at this time.  He denies flank pain.  Patient noted that his blood sugar was above 500 last night.  He took insulin to try to correct this with improvement.  The history is provided by the patient.  URI Hyperglycemia      Home Medications Prior to Admission medications   Medication Sig Start Date End Date Taking? Authorizing Provider  atorvastatin (LIPITOR) 20 MG tablet Take 20 mg by mouth daily.    [provider]  carvedilol (COREG) 6.25 MG tablet Take 6.25 mg by mouth 2 (two) times daily. 06/21/18    [provider]  clopidogrel (PLAVIX) 75 MG tablet Take 75 mg by mouth daily. 06/21/18   [provider]  furosemide (LASIX) 20 MG tablet Take 1 tablet (20 mg total) by mouth daily. 11/02/20 12/02/20  Antonieta Pert, MD  gabapentin (NEURONTIN) 100 MG capsule Take 100 mg by mouth 3 (three) times daily. 08/09/20   [provider]  insulin aspart protamine- aspart (NOVOLOG MIX 70/30) (70-30) 100 UNIT/ML injection Inject 15 Units into the skin 2 (two) times daily.    [provider]  sildenafil (VIAGRA) 100 MG tablet Take 100 mg by mouth daily as needed for erectile dysfunction. 09/23/20   [provider]  Vitamin D, Ergocalciferol, (DRISDOL) 1.25 MG (50000 UNIT) CAPS capsule Take 50,000 Units by mouth every Monday. 10/30/20   [provider]      Allergies    Shrimp extract allergy skin test    Review of Systems   Review of Systems  Physical Exam Updated Vital Signs BP 108/76   Pulse 73   Temp 98.3 F (36.8 C) (Oral)   Resp 18   Ht 5\' 9"  (1.753 m)   Wt 81.6 kg   SpO2 98%   BMI 26.58 kg/m  Physical Exam Vitals and nursing note reviewed.  Constitutional:      General: He is not in acute distress.    Appearance: He is  well-developed. He is not diaphoretic.  HENT:     Head: Normocephalic and atraumatic.  Eyes:     General: No scleral icterus.    Conjunctiva/sclera: Conjunctivae normal.  Cardiovascular:     Rate and Rhythm: Normal rate and regular rhythm.     Heart sounds: Normal heart sounds.  Pulmonary:     Effort: Pulmonary effort is normal. No respiratory distress.     Breath sounds: Normal breath sounds.  Abdominal:     General: There is no distension.     Palpations: Abdomen is soft.     Tenderness: There is no abdominal tenderness. There is no right CVA tenderness or left CVA tenderness.  Musculoskeletal:     Cervical back: Normal range of motion and neck supple.     Right lower leg: Edema present.     Left lower leg: Edema  present.  Skin:    General: Skin is warm and dry.  Neurological:     General: No focal deficit present.     Mental Status: He is alert.  Psychiatric:        Behavior: Behavior normal.     ED Results / Procedures / Treatments   Labs (all labs ordered are listed, but only abnormal results are displayed) Labs Reviewed  BASIC METABOLIC PANEL - Abnormal; Notable for the following components:      Result Value   Sodium 132 (*)    Potassium 5.7 (*)    CO2 20 (*)    Glucose, Bld 287 (*)    BUN 48 (*)    Creatinine, Ser 3.75 (*)    Calcium 8.2 (*)    GFR, Estimated 18 (*)    All other components within normal limits  CBC - Abnormal; Notable for the following components:   WBC 19.4 (*)    RBC 3.44 (*)    Hemoglobin 10.8 (*)    HCT 32.2 (*)    Platelets 130 (*)    All other components within normal limits  CBG MONITORING, ED - Abnormal; Notable for the following components:   Glucose-Capillary 270 (*)    All other components within normal limits  SARS CORONAVIRUS 2 BY RT PCR  URINALYSIS, ROUTINE W REFLEX MICROSCOPIC  CBG MONITORING, ED    EKG None  Radiology DG Chest 2 View  Result Date: 12/29/2021 CLINICAL DATA:  Cough.  Decreased appetite.  Generalized weakness. EXAM: CHEST - 2 VIEW COMPARISON:  AP chest 09/17/2021 FINDINGS: Cardiac silhouette is again mildly enlarged. Moderate calcification within the aortic arch. The lungs are clear. No pleural effusion or pneumothorax. Mild multilevel degenerative disc changes of the thoracic spine. IMPRESSION: Unchanged mild cardiomegaly.  No acute lung process. Electronically Signed   By: Neita Garnet M.D.   On: 12/29/2021 13:55    Procedures Procedures    Medications Ordered in ED Medications - No data to display  ED Course/ Medical Decision Making/ A&P Clinical Course as of 12/31/21 0630  Mon Dec 29, 2021  1652 WBC(!): 19.4 [AH]  1652 Glucose-Capillary(!): 351 [AH]  1653 DG Chest 2 View Visualized and interpreted 2 view  chest x-ray shows some cardiomegaly [AH]  1653 Creatinine(!): 3.75 Patient's creatinine noted to be 3.75 today.  It was 2.7 on 9 1 this represents acute kidney injury. [AH]  1813 Bladder scan reviewed only 105 in the bladder.  I have ordered fluids considering patient's elevated thick acid and AKI. [AH]  1942 Staff Bladder Scanned patient prior to urination which showed 155cc in bladder however  patient voided greater than 300 cc after scan? [BH]  1951 Post void residual 30 cc [BH]  2023 CT stone shows thickened bladder wall [BH]    Clinical Course User Index [AH] Arthor Captain, PA-C [BH] Henderly, Britni A, PA-C                           Medical Decision Making This patient presents to the ED for concern of ams , this involves an extensive number of treatment options, and is a complaint that carries with it a high risk of complications and morbidity.  The differential diagnosis is extensive and includes, but is not limited to: drug overdose - opioids, alcohol, sedatives, antipsychotics, drug withdrawal, others; Metabolic: hypoxia, hypoglycemia, hyperglycemia, hypercalcemia, hypernatremia, hyponatremia, uremia, hepatic encephalopathy, hypothyroidism, hyperthyroidism, vitamin B12 or thiamine deficiency, carbon monoxide poisoning, Wilson's disease, Lactic acidosis, DKA/HHOS; Infectious: meningitis, encephalitis, bacteremia/sepsis, urinary tract infection, pneumonia, neurosyphilis; Structural: Space-occupying lesion, (brain tumor, subdural hematoma, hydrocephalus,); Vascular: stroke, subarachnoid hemorrhage, coronary ischemia, hypertensive encephalopathy, CNS vasculitis, thrombotic thrombocytopenic purpura, disseminated intravascular coagulation, hyperviscosity; Psychiatric: Schizophrenia, depression; Other: Seizure, hypothermia, heat stroke, ICU psychosis, dementia -"sundowning."     Co morbidities that complicate the patient evaluation  Pt has CHF (HCC); CAD (coronary artery disease); Normocytic  normochromic anemia; CKD (chronic kidney disease), stage III (HCC); ; and DM (diabetes mellitus), type 2 with renal complications (HCC) on their problem list.    Additional history obtained:  Additional history obtained from wife at bedside External records from outside source obtained and reviewed including OP Urology notes [facility]    Lab Tests:  I Ordered, and personally interpreted labs.  The pertinent results include:  Marked leukocytosis, AKI, Hyperkalemia, lactic acidosis   Imaging Studies ordered:  I ordered imaging studies including cxr I independently visualized and interpreted imaging which showed no acute findings I agree with the radiologist interpretation   Cardiac Monitoring:       The patient was maintained on a cardiac monitor.  I personally viewed and interpreted the cardiac monitored which showed an underlying rhythm of: nsr   Medicines ordered and prescription drug management:  I ordered medication including  Medications sodium chloride 0.9 % bolus 1,000 mL (0 mLs Intravenous Stopped 12/29/21 1916) sodium chloride 0.9 % bolus 1,000 mL (0 mLs Intravenous Stopped 12/29/21 2052) For aki, lactic acidosis, failure of urine OP  Reevaluation of the patient after these medicines showed that the patient stayed the same I have reviewed the patients home medicines and have made adjustments as needed   Test Considered:       CT renal imaging   Critical Interventions:       fluid resuscitation   Consultations Obtained:  Case handoff at shift change to PA Henderly- UA pending conult urology  Amount and/or Complexity of Data Reviewed Labs: ordered. Decision-making details documented in ED Course. Radiology: ordered. Decision-making details documented in ED Course.  Risk Prescription drug management.           Final Clinical Impression(s) / ED Diagnoses Final diagnoses:  None    Rx / DC Orders ED Discharge Orders     None          Arthor Captain, PA-C 12/31/21 3762    Tegeler, Canary Brim, MD 01/01/22 1130

## 2021-12-30 ENCOUNTER — Telehealth (HOSPITAL_BASED_OUTPATIENT_CLINIC_OR_DEPARTMENT_OTHER): Payer: Self-pay | Admitting: Emergency Medicine

## 2021-12-30 LAB — BLOOD CULTURE ID PANEL (REFLEXED) - BCID2

## 2021-12-31 ENCOUNTER — Telehealth (HOSPITAL_BASED_OUTPATIENT_CLINIC_OR_DEPARTMENT_OTHER): Payer: Self-pay

## 2021-12-31 LAB — URINE CULTURE: Culture: >=100000 — AB

## 2021-12-31 NOTE — Telephone Encounter (Signed)
Called patient to inform of positive blood cultures, no answer, left voicemail

## 2021-12-31 NOTE — Telephone Encounter (Signed)
Spoke with patient who states he is at [facility] currently. Informed of positive blood cultures and recommendation to be re-evaluated. Pt understands and states he is being seen.

## 2022-01-01 ENCOUNTER — Telehealth (HOSPITAL_BASED_OUTPATIENT_CLINIC_OR_DEPARTMENT_OTHER): Payer: Self-pay | Admitting: *Deleted

## 2022-01-01 LAB — CULTURE, BLOOD (ROUTINE X 2): Special Requests: ADEQUATE

## 2022-01-01 NOTE — Telephone Encounter (Signed)
Post ED Visit - Positive Culture Follow-up  Culture report reviewed by antimicrobial stewardship pharmacist: [facility] []  [address], Pharm.D. []  [place], Pharm.D., BCPS AQ-ID []  , Pharm.D., BCPS []  Celedonio Miyamoto, Pharm.D., BCPS []  [place], Garvin Fila.D., BCPS, AAHIVP []  , Pharm.D., BCPS, AAHIVP []  Georgina Pillion, PharmD, BCPS []  , PharmD, BCPS []  [place], PharmD, BCPS []  [place], PharmD []  , PharmD, BCPS []  Estella Husk, PharmD  Pharmacy Team []  Lysle Pearl, PharmD []  , PharmD []  Phillips Climes, PharmD []  , Rph []  Agapito Games) , PharmD []  Verlan Friends, PharmD []  , PharmD []  Mervyn Gay, PharmD []  , PharmD []  Vinnie Level, PharmD []  [facility], PharmD []  , PharmD []  Len Childs, PharmD   Positive urine culture Treated with Cephalexin organism sensitive to the same and no further patient follow-up is required at this time.  Reviewed by Pharmacy  01/01/2022, 12:50 PM

## 2022-01-03 LAB — CULTURE, BLOOD (ROUTINE X 2): Culture: NO GROWTH

## 2023-12-20 DEATH — deceased
